# Patient Record
Sex: Female | Born: 1990 | Race: White | Hispanic: No | Marital: Married | Smoking: Never smoker
Health system: Southern US, Community
[De-identification: ages and names within clinical notes are randomized; demographics above are authoritative.]

## PROBLEM LIST (undated history)

## (undated) DIAGNOSIS — L709 Acne, unspecified: Secondary | ICD-10-CM

## (undated) DIAGNOSIS — Z23 Encounter for immunization: Secondary | ICD-10-CM

## (undated) DIAGNOSIS — J309 Allergic rhinitis, unspecified: Secondary | ICD-10-CM

## (undated) HISTORY — DX: Acne, unspecified: L70.9

## (undated) HISTORY — DX: Allergic rhinitis, unspecified: J30.9

## (undated) HISTORY — DX: Encounter for immunization: Z23

## (undated) HISTORY — PX: NO PAST SURGERIES: SHX2092

---
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
## 2016-02-02 DIAGNOSIS — R002 Palpitations: Secondary | ICD-10-CM | POA: Insufficient documentation

## 2016-04-10 ENCOUNTER — Emergency Department
Admission: EM | Admit: 2016-04-10 | Discharge: 2016-04-10 | Disposition: A | Discharge status: Home / Self Care | Payer: BLUE CROSS/BLUE SHIELD | Attending: Emergency Medicine | Admitting: Emergency Medicine

## 2016-04-10 ENCOUNTER — Encounter: Payer: Self-pay | Admitting: Emergency Medicine

## 2016-04-10 DIAGNOSIS — R109 Unspecified abdominal pain: Secondary | ICD-10-CM | POA: Diagnosis present

## 2016-04-10 DIAGNOSIS — K529 Noninfective gastroenteritis and colitis, unspecified: Secondary | ICD-10-CM | POA: Diagnosis not present

## 2016-04-10 LAB — COMPREHENSIVE METABOLIC PANEL WITH GFR
ALT: 46 U/L (ref 14–54)
AST: 36 U/L (ref 15–41)
Albumin: 4.7 g/dL (ref 3.5–5.0)
Alkaline Phosphatase: 45 U/L (ref 38–126)
Anion gap: 12 (ref 5–15)
BUN: 16 mg/dL (ref 6–20)
CO2: 22 mmol/L (ref 22–32)
Calcium: 9.3 mg/dL (ref 8.9–10.3)
Chloride: 101 mmol/L (ref 101–111)
Creatinine, Ser: 0.62 mg/dL (ref 0.44–1.00)
GFR calc Af Amer: >60 mL/min
GFR calc non Af Amer: >60 mL/min
Glucose, Bld: 95 mg/dL (ref 65–99)
Potassium: 3.6 mmol/L (ref 3.5–5.1)
Sodium: 135 mmol/L (ref 135–145)
Total Bilirubin: 1.1 mg/dL (ref 0.3–1.2)
Total Protein: 8.2 g/dL — ABNORMAL HIGH (ref 6.5–8.1)

## 2016-04-10 LAB — URINALYSIS COMPLETE WITH MICROSCOPIC (ARMC ONLY)
Bilirubin Urine: NEGATIVE
Glucose, UA: NEGATIVE mg/dL
Hgb urine dipstick: NEGATIVE
Leukocytes, UA: NEGATIVE
Nitrite: NEGATIVE
Protein, ur: NEGATIVE mg/dL
RBC / HPF: NONE SEEN RBC/hpf (ref 0–5)
Specific Gravity, Urine: 1.025 (ref 1.005–1.030)
pH: 5 (ref 5.0–8.0)

## 2016-04-10 LAB — CBC
HCT: 43.2 % (ref 35.0–47.0)
Hemoglobin: 15.3 g/dL (ref 12.0–16.0)
MCH: 30.9 pg (ref 26.0–34.0)
MCHC: 35.4 g/dL (ref 32.0–36.0)
MCV: 87.1 fL (ref 80.0–100.0)
Platelets: 231 K/uL (ref 150–440)
RBC: 4.95 MIL/uL (ref 3.80–5.20)
RDW: 13.1 % (ref 11.5–14.5)
WBC: 16.1 K/uL — ABNORMAL HIGH (ref 3.6–11.0)

## 2016-04-10 LAB — PREGNANCY, URINE: Preg Test, Ur: NEGATIVE

## 2016-04-10 LAB — LIPASE, BLOOD: Lipase: 28 U/L (ref 11–51)

## 2016-04-10 MED ORDER — ONDANSETRON HCL 4 MG PO TABS: 4.0000 mg | ORAL_TABLET | Freq: Three times a day (TID) | ORAL | 0 refills | Status: DC | PRN

## 2016-04-10 MED ORDER — SODIUM CHLORIDE 0.9 % IV BOLUS (SEPSIS)
1000.0000 mL | Freq: Once | INTRAVENOUS | Status: AC
  Administered 2016-04-10: 1000 mL via INTRAVENOUS

## 2016-04-10 MED ORDER — ONDANSETRON HCL 4 MG/2ML IJ SOLN
4.0000 mg | Freq: Once | INTRAMUSCULAR | Status: AC
  Administered 2016-04-10: 4 mg via INTRAVENOUS
  Filled 2016-04-10: qty 2

## 2016-04-10 NOTE — ED Provider Notes (Signed)
[facility] Emergency Department Provider Note    ____________________________________________   I have reviewed the triage vital signs and the nursing notes.   HISTORY  Chief Complaint Emesis and Abdominal Pain   History limited by: Not Limited   HPI Alexandria Owen is a 25 y.o. female who presents to the emergency department today because of concerns for nausea and vomiting. The patient states that these symptoms started today. She has had multiple episodes of both vomiting and diarrhea. She has not no 70 blood in either. This has been accompanied by some mild abdominal discomfort. Patient has not appreciated any fevers. She did not eat or drink anything unusual last night. No known sick contacts.   History reviewed. No pertinent past medical history.  There are no active problems to display for this patient.   History reviewed. No pertinent surgical history.  Prior to Admission medications   Not on File    Allergies Review of patient's allergies indicates no known allergies.  No family history on file.  Social History Social History  Substance Use Topics  . Smoking status: Never Smoker  . Smokeless tobacco: Never Used  . Alcohol use Yes     Comment: occasional    Review of Systems  Constitutional: Negative for fever. Cardiovascular: Negative for chest pain. Respiratory: Negative for shortness of breath. Gastrointestinal: Positive for nausea vomiting and diarrhea. Mild amount of abdominal discomfort. Neurological: Negative for headaches, focal weakness or numbness.   10-point ROS otherwise negative.  ____________________________________________   PHYSICAL EXAM:  VITAL SIGNS: ED Triage Vitals [04/10/16 1652]  Enc Vitals Group     BP 127/81     Pulse Rate (!) 111     Resp 16     Temp 99.1 F (37.3 C)     Temp Source Oral     SpO2 98 %     Weight 170 lb (77.1 kg)     Height 5\' 8"  (1.727 m)     Head Circumference      Peak  Flow      Pain Score 0   Constitutional: Alert and oriented. Well appearing and in no distress. Eyes: Conjunctivae are normal. Normal extraocular movements. ENT   Head: Normocephalic and atraumatic.   Nose: No congestion/rhinnorhea.   Mouth/Throat: Mucous membranes are moist.   Neck: No stridor. Hematological/Lymphatic/Immunilogical: No cervical lymphadenopathy. Cardiovascular: Normal rate, regular rhythm.  No murmurs, rubs, or gallops. Respiratory: Normal respiratory effort without tachypnea nor retractions. Breath sounds are clear and equal bilaterally. No wheezes/rales/rhonchi. Gastrointestinal: Soft and Minimally tender to palpation diffusely. Somewhat worse tenderness in the right lower quadrant. Genitourinary: Deferred Musculoskeletal: Normal range of motion in all extremities. No lower extremity edema. Neurologic:  Normal speech and language. No gross focal neurologic deficits are appreciated.  Skin:  Skin is warm, dry and intact. No rash noted. Psychiatric: Mood and affect are normal. Speech and behavior are normal. Patient exhibits appropriate insight and judgment.  ____________________________________________    LABS (pertinent positives/negatives)  Labs Reviewed  COMPREHENSIVE METABOLIC PANEL - Abnormal; Notable for the following:       Result Value   Total Protein 8.2 (*)    All other components within normal limits  CBC - Abnormal; Notable for the following:    WBC 16.1 (*)    All other components within normal limits  URINALYSIS COMPLETEWITH MICROSCOPIC ([facility] ONLY) - Abnormal; Notable for the following:    Color, Urine YELLOW (*)    APPearance CLEAR (*)  Ketones, ur 1+ (*)    Bacteria, UA RARE (*)    Squamous Epithelial / LPF 0-5 (*)    All other components within normal limits  LIPASE, BLOOD  PREGNANCY, URINE     ____________________________________________   EKG  None  ____________________________________________     RADIOLOGY  None  ____________________________________________   PROCEDURES  Procedures  ____________________________________________   INITIAL IMPRESSION / ASSESSMENT AND PLAN / ED COURSE  Pertinent labs & imaging results that were available during my care of the patient were reviewed by me and considered in my medical decision making (see chart for details).  Patient presented to the emergency department today because of episodes of nausea vomiting and diarrhea. On exam she had some mild diffuse abdominal tenderness perhaps slightly worse in the right lower quadrant. I had a discussion with the patient. At this point I would doubt appendicitis given the fairly unremarkable exam and that the patient herself is not experiencing any pain in the right lower quadrant. Additionally no fevers. The patient did have leukocytosis. Patient felt better after fluids and medication and repeat abd exam without significant tenderness. We did discuss risks and benefits of CT scan. At this juncture the patient chose to defer ct scan which I think is reasonable. We did discuss appendicitis return precautions. ____________________________________________   FINAL CLINICAL IMPRESSION(S) / ED DIAGNOSES  Final diagnoses:  Gastroenteritis     Note: This dictation was prepared with Dragon dictation. Any transcriptional errors that result from this process are unintentional    Nance Pear, MD 04/10/16 2120

## 2016-04-10 NOTE — Discharge Instructions (Signed)
Please seek medical attention for any high fevers, chest pain, shortness of breath, change in behavior, persistent vomiting, bloody stool or any other new or concerning symptoms.  

## 2016-04-10 NOTE — ED Triage Notes (Signed)
Vomiting since 0300.  Diarrhea since 0900.  Also c/o mid abdominal pain.

## 2016-04-10 NOTE — ED Notes (Signed)

## 2017-12-16 ENCOUNTER — Ambulatory Visit (INDEPENDENT_AMBULATORY_CARE_PROVIDER_SITE_OTHER): Payer: Managed Care, Other (non HMO) | Admitting: Obstetrics and Gynecology

## 2017-12-16 ENCOUNTER — Encounter: Payer: Self-pay | Admitting: Obstetrics and Gynecology

## 2017-12-16 VITALS — BP 122/80 | HR 92 | Ht 68.0 in | Wt 175.0 lb

## 2017-12-16 DIAGNOSIS — Z124 Encounter for screening for malignant neoplasm of cervix: Secondary | ICD-10-CM

## 2017-12-16 DIAGNOSIS — Z3046 Encounter for surveillance of implantable subdermal contraceptive: Secondary | ICD-10-CM

## 2017-12-16 DIAGNOSIS — Z3169 Encounter for other general counseling and advice on procreation: Secondary | ICD-10-CM | POA: Diagnosis not present

## 2017-12-16 DIAGNOSIS — Z01419 Encounter for gynecological examination (general) (routine) without abnormal findings: Secondary | ICD-10-CM | POA: Diagnosis not present

## 2017-12-16 NOTE — Patient Instructions (Signed)
I value your feedback and entrusting us with your care. If you get a [place] patient survey, I would appreciate you taking the time to let us know about your experience today. Thank you! 

## 2017-12-16 NOTE — Progress Notes (Signed)
PCP:  Patient, No Pcp Per   Chief Complaint  Patient presents with  . Gynecologic Exam    Would like to talk about getting pregnant      HPI:      [name] is a 27 y.o. G0P0000 who LMP was Patient's last menstrual period was 12/02/2017 (approximate)., presents today for her annual examination.  Her menses are Q4-5 wks with nexplanon, lasting 7 days.  Dysmenorrhea none. She does have intermenstrual bleeding.  Sex activity: single partner, contraception -  Nexplanon placed 01/25/15. Pt interested in conception later this fall.  Last Pap: October 28, 2014  Results were: no abnormalities  Hx of STDs: none  There is no FH of breast cancer. There is no FH of ovarian cancer. The patient does do self-breast exams.  Tobacco use: The patient denies current or previous tobacco use. Alcohol use: social drinker No drug use.  Exercise: moderately active  She does get adequate calcium and Vitamin D in her diet.   Past Medical History:  Diagnosis Date  . Allergic rhinitis   . Vaccine for human papilloma virus (HPV) types 6, 11, 16, and 18 administered     History reviewed. No pertinent surgical history.  Family History  Problem Relation Age of Onset  . Breast cancer Neg Hx   . Ovarian cancer Neg Hx     Social History   Socioeconomic History  . Marital status: Unknown    Spouse name: Not on file  . Number of children: Not on file  . Years of education: Not on file  . Highest education level: Not on file  Occupational History  . Not on file  Social Needs  . Financial resource strain: Not on file  . Food insecurity:    Worry: Not on file    Inability: Not on file  . Transportation needs:    Medical: Not on file    Non-medical: Not on file  Tobacco Use  . Smoking status: Never Smoker  . Smokeless tobacco: Never Used  Substance and Sexual Activity  . Alcohol use: Yes    Comment: occasional  . Drug use: No  . Sexual activity: Yes    Birth control/protection:  Implant  Lifestyle  . Physical activity:    Days per week: Not on file    Minutes per session: Not on file  . Stress: Not on file  Relationships  . Social connections:    Talks on phone: Not on file    Gets together: Not on file    Attends religious service: Not on file    Active member of club or organization: Not on file    Attends meetings of clubs or organizations: Not on file    Relationship status: Not on file  . Intimate partner violence:    Fear of current or ex partner: Not on file    Emotionally abused: Not on file    Physically abused: Not on file    Forced sexual activity: Not on file  Other Topics Concern  . Not on file  Social History Narrative  . Not on file    Outpatient Medications Prior to Visit  Medication Sig Dispense Refill  . etonogestrel (NEXPLANON) 68 MG IMPL implant Inject into the skin.    Marland Kitchen omeprazole (PRILOSEC) 2 mg/mL SUSP Take by mouth.    . ondansetron (ZOFRAN) 4 MG tablet Take 1 tablet (4 mg total) by mouth every 8 (eight) hours as needed for nausea or vomiting. (Patient  not taking: Reported on 12/16/2017) 20 tablet 0   No facility-administered medications prior to visit.     ROS:  Review of Systems  Constitutional: Negative for fatigue, fever and unexpected weight change.  Respiratory: Negative for cough, shortness of breath and wheezing.   Cardiovascular: Negative for chest pain, palpitations and leg swelling.  Gastrointestinal: Negative for blood in stool, constipation, diarrhea, nausea and vomiting.  Endocrine: Negative for cold intolerance, heat intolerance and polyuria.  Genitourinary: Positive for dyspareunia. Negative for dysuria, flank pain, frequency, genital sores, hematuria, menstrual problem, pelvic pain, urgency, vaginal bleeding, vaginal discharge and vaginal pain.  Musculoskeletal: Negative for back pain, joint swelling and myalgias.  Skin: Negative for rash.  Neurological: Negative for dizziness, syncope, light-headedness,  numbness and headaches.  Hematological: Negative for adenopathy.  Psychiatric/Behavioral: Negative for agitation, confusion, sleep disturbance and suicidal ideas. The patient is not nervous/anxious.   BREAST: Pos for tenderness   Objective: BP 122/80   Pulse 92   Ht 5\' 8"  (1.727 m)   Wt 175 lb (79.4 kg)   LMP 12/02/2017 (Approximate)   BMI 26.61 kg/m    Physical Exam  Constitutional: She is oriented to person, place, and time. She appears well-developed and well-nourished.  Genitourinary: Vagina normal and uterus normal. There is no rash or tenderness on the right labia. There is no rash or tenderness on the left labia. No erythema or tenderness in the vagina. No vaginal discharge found. Right adnexum does not display mass and does not display tenderness. Left adnexum does not display mass and does not display tenderness. Cervix does not exhibit motion tenderness or polyp. Uterus is not enlarged or tender.  Neck: Normal range of motion. No thyromegaly present.  Cardiovascular: Normal rate, regular rhythm and normal heart sounds.  No murmur heard. Pulmonary/Chest: Effort normal and breath sounds normal. Right breast exhibits no mass, no nipple discharge, no skin change and no tenderness. Left breast exhibits no mass, no nipple discharge, no skin change and no tenderness.  Abdominal: Soft. There is no tenderness. There is no guarding.  Musculoskeletal: Normal range of motion.  Neurological: She is alert and oriented to person, place, and time. No cranial nerve deficit.  Psychiatric: She has a normal mood and affect. Her behavior is normal.  Vitals reviewed.   Assessment/Plan: Encounter for annual routine gynecological examination  Cervical cancer screening - Plan: IGP, rfx Aptima HPV ASCU  Encounter for surveillance of implantable subdermal contraceptive - Due for removal 6/16. RTO for removal prn. Wants to conceive this fall.  Pre-conception counseling - Start PNVs.         GYN  counsel adequate intake of calcium and vitamin D, diet and exercise     F/U  Return in about 1 year (around 12/17/2018).  Alicia B. Copland, PA-C 12/16/2017 2:32 PM

## 2017-12-18 ENCOUNTER — Ambulatory Visit: Payer: Managed Care, Other (non HMO) | Admitting: Obstetrics and Gynecology

## 2017-12-18 ENCOUNTER — Encounter: Payer: Self-pay | Admitting: Obstetrics and Gynecology

## 2017-12-18 VITALS — BP 114/78 | HR 83 | Ht 68.0 in | Wt 170.0 lb

## 2017-12-18 DIAGNOSIS — Z3046 Encounter for surveillance of implantable subdermal contraceptive: Secondary | ICD-10-CM | POA: Diagnosis not present

## 2017-12-18 LAB — IGP, RFX APTIMA HPV ASCU: PAP SMEAR COMMENT: 0

## 2017-12-18 NOTE — Progress Notes (Addendum)
   Chief Complaint  Patient presents with  . Contraception    Nexplanon removal      History of Present Illness:  Alexandria Owen is a 26 y.o. that had a nexplanon placed approximately 3 years  ago. Since that time, she has done well. She would like to conceive this fall.   Nexplanon removal Procedure note - The Nexplanon was noted in the patient's arm and the end was identified. The skin was cleansed with a Betadine solution. A small injection of subcutaneous lidocaine with epinephrine was given over the end of the implant. An incision was made at the end of the implant. The rod was noted in the incision and grasped with a hemostat. It was noted to be intact.  Steri-Strip was placed approximating the incision. Hemostasis was noted.  Assessment: Nexplanon removal Pt plans to conceive in the fall. Condoms in meantime.   Plan:   She was told to remove the dressing in 12-24 hours, to keep the incision area dry for 24 hours and to remove the Steristrip in 2-3  days.  Notify us if any signs of tenderness, redness, pain, or fevers develop.   Alicia B. Copland, PA-C 12/18/2017 11:52 AM

## 2017-12-18 NOTE — Patient Instructions (Signed)
I value your feedback and entrusting us with your care. If you get a [place] patient survey, I would appreciate you taking the time to let us know about your experience today. Thank you!  Remove the dressing in 24 hours,  keep the incision area dry for 24 hours and remove the Steristrip in 2-3  days.  Notify us if any signs of tenderness, redness, pain, or fevers develop.  

## 2018-08-13 NOTE — L&D Delivery Note (Signed)
Date of delivery: 06/05/2019 Estimated Date of Delivery: 05/26/19 Patient's last menstrual period was 08/19/2018 (exact date). EGA: [redacted]w[redacted]d  Delivery Note At 7:26 PM a viable female was delivered via Vaginal, Spontaneous Presentation: OA; ROA). APGAR: 8, 9; weight 8 lb 1.8 oz (3680 g).   Placenta status: manual extraction, intact.  Cord:  with the following complications: none.  Cord pH: NA  Called to see patient.  Mom pushed well to deliver a viable female infant.  The head followed by shoulders, which delivered without difficulty, and the rest of the body.  No nuchal cord noted.  Baby to mom's chest.  Cord clamped and cut after 3 min delay.  Cord blood obtained. Called Dr Georgianne Fick approximately 38 minutes after delivery after attempting to deliver placenta. Placenta delivered manually, intact, with a 3-vessel cord. Gentamicin and Clindamycin IV prophylaxis given. All counts correct.  Hemostasis obtained with IV pitocin and fundal massage.  Please see note from Dr Georgianne Fick regarding manual extraction of placenta.   Anesthesia:  epidural Episiotomy:  none Lacerations:  Left vaginal wall abrasion, hemostatic Suture Repair: none Est. Blood Loss (mL):  1710  Mom to postpartum.  Baby to Couplet care / Skin to Skin.  Rod Can, CNM 06/05/2019, 8:47 PM

## 2018-09-25 ENCOUNTER — Other Ambulatory Visit (HOSPITAL_COMMUNITY)
Admission: RE | Admit: 2018-09-25 | Discharge: 2018-09-25 | Disposition: A | Discharge status: Home / Self Care | Payer: Managed Care, Other (non HMO) | Source: Ambulatory Visit | Attending: Advanced Practice Midwife | Admitting: Advanced Practice Midwife

## 2018-09-25 ENCOUNTER — Encounter: Payer: Self-pay | Admitting: Advanced Practice Midwife

## 2018-09-25 ENCOUNTER — Ambulatory Visit (INDEPENDENT_AMBULATORY_CARE_PROVIDER_SITE_OTHER): Payer: Managed Care, Other (non HMO) | Admitting: Advanced Practice Midwife

## 2018-09-25 VITALS — BP 110/58 | Wt 175.0 lb

## 2018-09-25 DIAGNOSIS — Z113 Encounter for screening for infections with a predominantly sexual mode of transmission: Secondary | ICD-10-CM | POA: Diagnosis present

## 2018-09-25 DIAGNOSIS — Z3400 Encounter for supervision of normal first pregnancy, unspecified trimester: Secondary | ICD-10-CM

## 2018-09-25 DIAGNOSIS — Z3A01 Less than 8 weeks gestation of pregnancy: Secondary | ICD-10-CM

## 2018-09-25 DIAGNOSIS — Z3401 Encounter for supervision of normal first pregnancy, first trimester: Secondary | ICD-10-CM

## 2018-09-25 NOTE — Progress Notes (Signed)
New Obstetric Patient H&P    Chief Complaint: "Desires prenatal care"   History of Present Illness: Patient is a 28 y.o. G1P0000 Not Hispanic or Latino female, presents with amenorrhea and positive home pregnancy test. Patient's last menstrual period was 08/19/2018 (exact date). and based on her  LMP, her EDD is Estimated Date of Delivery: 05/26/19 and her EGA is [redacted]w[redacted]d. Cycles are 5. days, regular, and occur approximately every : 28 days. Her last pap smear was 1 years ago and was no abnormalities.    She had a urine pregnancy test which was positive 1 day(s)  ago. Her last menstrual period was normal and lasted for  3 or 4 day(s). Since her LMP she claims she has experienced breast tenderness, fatigue. She denies vaginal bleeding. Her past medical history is noncontributory. This is her first pregnancy.  Since her LMP, she admits to the use of tobacco products  no She claims she has gained   3 pounds since the start of her pregnancy.  There are cats in the home in the home  No, she does have rabbits She admits close contact with children on a regular basis  no  She has had chicken pox in the past no She has had Tuberculosis exposures, symptoms, or previously tested positive for TB   no Current or past history of domestic violence. no  Genetic Screening/Teratology Counseling: (Includes patient, baby's father, or anyone in either family with:)   30. Patient's age >/= 89 at [facility]  no 2. Thalassemia ([place], [place], [place], or Asian background): MCV<80  no 3. Neural tube defect (meningomyelocele, spina bifida, anencephaly)  no 4. Congenital heart defect  no  5. Down syndrome  no 6. Tay-Sachs (Jewish, [place])  no 7. Canavan's Disease  no 8. Sickle cell disease or trait (African)  no  9. Hemophilia or other blood disorders  no  10. Muscular dystrophy  no  11. Cystic fibrosis  no  12. Huntington's Chorea  no  13. Mental retardation/autism  no 14. Other inherited genetic or  chromosomal disorder  no 15. Maternal metabolic disorder (DM, PKU, etc)  no 16. Patient or FOB with a child with a birth defect not listed above no  16a. Patient or FOB with a birth defect themselves no 17. Recurrent pregnancy loss, or stillbirth  no  18. Any medications since LMP other than prenatal vitamins (include vitamins, supplements, OTC meds, drugs, alcohol)  no 19. Any other genetic/environmental exposure to discuss  no  Infection History:   1. Lives with someone with TB or TB exposed  no  2. Patient or partner has history of genital herpes  no 3. Rash or viral illness since LMP  no 4. History of STI (GC, CT, HPV, syphilis, HIV)  no 5. History of recent travel :  no  Other pertinent information:  no     Review of Systems:10 point review of systems negative unless otherwise noted in HPI  Past Medical History:  Past Medical History:  Diagnosis Date  . Allergic rhinitis   . Vaccine for human papilloma virus (HPV) types 6, 11, 16, and 18 administered     Past Surgical History:  History reviewed. No pertinent surgical history.  Gynecologic History: Patient's last menstrual period was 08/19/2018 (exact date).  Obstetric History: G1P0000  Family History:  Family History  Problem Relation Age of Onset  . Breast cancer Neg Hx   . Ovarian cancer Neg Hx     Social History:  Social  History   Socioeconomic History  . Marital status: Married    Spouse name: Not on file  . Number of children: Not on file  . Years of education: Not on file  . Highest education level: Not on file  Occupational History  . Not on file  Social Needs  . Financial resource strain: Not on file  . Food insecurity:    Worry: Not on file    Inability: Not on file  . Transportation needs:    Medical: Not on file    Non-medical: Not on file  Tobacco Use  . Smoking status: Never Smoker  . Smokeless tobacco: Never Used  Substance and Sexual Activity  . Alcohol use: Yes    Comment:  occasional  . Drug use: No  . Sexual activity: Yes    Birth control/protection: None  Lifestyle  . Physical activity:    Days per week: Not on file    Minutes per session: Not on file  . Stress: Not on file  Relationships  . Social connections:    Talks on phone: Not on file    Gets together: Not on file    Attends religious service: Not on file    Active member of club or organization: Not on file    Attends meetings of clubs or organizations: Not on file    Relationship status: Not on file  . Intimate partner violence:    Fear of current or ex partner: Not on file    Emotionally abused: Not on file    Physically abused: Not on file    Forced sexual activity: Not on file  Other Topics Concern  . Not on file  Social History Narrative  . Not on file    Allergies:  No Known Allergies  Medications: Prior to Admission medications   Medication Sig Start Date End Date Taking? Authorizing Provider  Prenatal Vit-Fe Fumarate-FA (MULTIVITAMIN-PRENATAL) 27-0.8 MG TABS tablet Take 1 tablet by mouth daily at 12 noon.   Yes [provider]  omeprazole (PRILOSEC) 2 mg/mL SUSP Take by mouth.    [provider]  ondansetron (ZOFRAN) 4 MG tablet Take 1 tablet (4 mg total) by mouth every 8 (eight) hours as needed for nausea or vomiting. Patient not taking: Reported on 12/16/2017 04/10/16   Nance Pear, MD    Physical Exam Vitals: Blood pressure (!) 110/58, weight 175 lb (79.4 kg), last menstrual period 08/19/2018.  General: NAD HEENT: normocephalic, anicteric Thyroid: no enlargement, no palpable nodules Pulmonary: No increased work of breathing, CTAB Cardiovascular: RRR, distal pulses 2+ Abdomen: NABS, soft, non-tender, non-distended.  Umbilicus without lesions.  No hepatomegaly, splenomegaly or masses palpable. No evidence of hernia  Genitourinary: deferred for no concerns/recent PAP smear Extremities: no edema, erythema, or tenderness Neurologic: Grossly  intact Psychiatric: mood appropriate, affect full   Assessment: 28 y.o. G1P0000 at [redacted]w[redacted]d presenting to initiate prenatal care  Plan: 1) Avoid alcoholic beverages. 2) Patient encouraged not to smoke.  3) Discontinue the use of all non-medicinal drugs and chemicals.  4) Take prenatal vitamins daily.  5) Nutrition, food safety (fish, cheese advisories, and high nitrite foods) and exercise discussed. 6) Hospital and practice style discussed with cross coverage system.  7) Genetic Screening, such as with 1st Trimester Screening, cell free fetal DNA, AFP testing, and Ultrasound, as well as with amniocentesis and CVS as appropriate, is discussed with patient. At the conclusion of today's visit patient requested cell free DNA genetic testing 8) Patient is asked about  travel to areas at risk for the [place] virus, and counseled to avoid travel and exposure to mosquitoes or sexual partners who may have themselves been exposed to the virus. Testing is discussed, and will be ordered as appropriate. 9) Urine culture and urine aptima today 10) Return in 2 weeks for dating scan and rob    Rod Can, [place], [facility] 09/25/2018, 4:13 PM

## 2018-09-25 NOTE — Patient Instructions (Signed)
Exercise During Pregnancy For people of all ages, exercise is an important part of being healthy. Exercise improves heart and lung function and helps to maintain strength, flexibility, and a healthy body weight. Exercise also boosts energy levels and elevates mood. For most women, maintaining an exercise routine throughout pregnancy is recommended. It is only on rare occasions and with certain medical conditions or pregnancy complications that women may be asked to limit or avoid exercise during pregnancy. What are some other benefits to exercising during pregnancy? Along with maintaining strength and flexibility, exercising throughout pregnancy can help to:  Keep strength in muscles that are very important during labor and childbirth.  Decrease low back pain during pregnancy.  Decrease the risk of developing gestational diabetes mellitus (GDM).  Improve blood sugar (glucose) control for women who have GDM.  Decrease the risk of developing preeclampsia. This is a serious condition that causes high blood pressure along with other symptoms, such as swelling and headaches.  Decrease the risk of cesarean delivery.  Speed up the recovery after giving birth. How often should I exercise? Unless your health care provider gives you different instructions, you should try to exercise on most days or all days of the week. In general, try to exercise with moderate intensity for about 150 minutes per week. This can be spread out across several days, such as exercising for 30 minutes per day on 5 days of each week. You can tell that you are exercising at a moderate intensity if you have a higher heart rate and faster breathing, but you are still able to hold a conversation. What types of moderate-intensity exercise are recommended during pregnancy? There are many types of exercise that are safe for you to do during pregnancy. Unless your health care provider gives you different instructions, do a variety of  exercises that safely increase your heart and breathing (cardiopulmonary) rates and help you to build and maintain muscle strength (strength training). You should always be able to talk in full sentences while exercising during pregnancy. Some examples of exercising that is safe to do during pregnancy include:  Brisk walking or hiking.  Swimming.  Water aerobics.  Riding a stationary bike.  Strength training.  Modified yoga or Pilates. Tell your instructor that you are pregnant. Avoid overstretching and avoid lying on your back for long periods of time.  Running or jogging. Only choose this type of exercise if: ? You ran or jogged regularly before your pregnancy. ? You can run or jog and still talk in complete sentences. What types of exercise should I not do during pregnancy? Depending on your level of fitness and whether you exercised regularly before your pregnancy, you may be advised to limit vigorous-intensity exercise during your pregnancy. You can tell that you are exercising at a vigorous intensity if you are breathing much harder and faster and cannot hold a conversation while exercising. Some examples of exercising that you should avoid during pregnancy include:  Contact sports.  Activities that place you at risk for falling on or being hit in the belly, such as downhill skiing, water skiing, surfing, rock climbing, cycling, gymnastics, and horseback riding.  Scuba diving.  Sky diving.  Yoga or Pilates in a room that is heated to extreme temperatures ("hot yoga" or "hot Pilates").  Jogging or running, unless you ran or jogged regularly before your pregnancy. While jogging or running, you should always be able to talk in full sentences. Do not run or jog so vigorously that you  are unable to have a conversation.  If you are not used to exercising at elevation (more than 6,000 feet above sea level), do not do so during your pregnancy. When should I avoid exercising during  pregnancy? Certain medical conditions can make it unsafe to exercise during pregnancy, or they may increase your risk of miscarriage or early labor and birth. Some of these conditions include:  Some types of heart disease.  Some types of lung disease.  Placenta previa. This is when the placenta partially or completely covers the opening of the uterus (cervix).  Frequent bleeding from the vagina during your pregnancy.  Incompetent cervix. This is when your cervix does not remain as tightly closed during pregnancy as it should.  Premature labor.  Ruptured membranes. This is when the protective sac (amniotic sac) opens up and amniotic fluid leaks from your vagina.  Severely low blood count (anemia).  Preeclampsia or pregnancy-caused high blood pressure.  Carrying more than one baby (multiple gestation) and having an additional risk of early labor.  Poorly controlled diabetes.  Being severely underweight or severely overweight.  Intrauterine growth restriction. This is when your baby's growth and development during pregnancy are slower than expected.  Other medical conditions. Ask your health care provider if any apply to you. What else should I know about exercising during pregnancy? You should take these precautions while exercising during pregnancy:  Avoid overheating. ? Wear loose-fitting, breathable clothes. ? Do not exercise in very high temperatures.  Avoid dehydration. Drink enough water before, during, and after exercise to keep your urine clear or pale yellow.  Avoid overstretching. Because of hormone changes during pregnancy, it is easy to overstretch muscles, tendons, and ligaments during pregnancy.  Start slowly and ask your health care provider to recommend types of exercise that are safe for you, if exercising regularly is new for you. Pregnancy is not a time for exercising to lose weight. When should I seek medical care? You should stop exercising and call your  health care provider if you have any unusual symptoms, such as:  Mild uterine contractions or abdominal cramping.  Dizziness that does not improve with rest. When should I seek immediate medical care? You should stop exercising and call your local emergency services (911 in the [place] if you have any unusual symptoms, such as:  Sudden, severe pain in your low back or your belly.  Uterine contractions or abdominal cramping that do not improve with rest.  Chest pain.  Bleeding or fluid leaking from your vagina.  Shortness of breath. This information is not intended to replace advice given to you by your health care provider. Make sure you discuss any questions you have with your health care provider. Document Released: 07/30/2005 Document Revised: 12/28/2015 Document Reviewed: 10/07/2014 Elsevier Interactive Patient Education  2019 [employer] for Pregnant Women While you are pregnant, your body requires additional nutrition to help support your growing baby. You also have a higher need for some vitamins and minerals, such as folic acid, calcium, iron, and vitamin D. Eating a healthy, well-balanced diet is very important for your health and your baby's health. Your need for extra calories varies for the three 37-month segments of your pregnancy (trimesters). For most women, it is recommended to consume:  150 extra calories a day during the first trimester.  300 extra calories a day during the second trimester.  300 extra calories a day during the third trimester. What are tips for following this plan?   Do  not try to lose weight or go on a diet during pregnancy.  Limit your overall intake of foods that have "empty calories." These are foods that have little nutritional value, such as sweets, desserts, candies, and sugar-sweetened beverages.  Eat a variety of foods (especially fruits and vegetables) to get a full range of vitamins and minerals.  Take a prenatal vitamin  to help meet your additional vitamin and mineral needs during pregnancy, specifically for folic acid, iron, calcium, and vitamin D.  Remember to stay active. Ask your health care provider what types of exercise and activities are safe for you.  Practice good food safety and cleanliness. Wash your hands before you eat and after you prepare raw meat. Wash all fruits and vegetables well before peeling or eating. Taking these actions can help to prevent food-borne illnesses that can be very dangerous to your baby, such as listeriosis. Ask your health care provider for more information about listeriosis. What does 150 extra calories look like? Healthy options that provide 150 extra calories each day could be any of the following:  6-8 oz (170-230 g) of plain low-fat yogurt with  cup of berries.  1 apple with 2 teaspoons (11 g) of peanut butter.  Cut-up vegetables with  cup (60 g) of hummus.  8 oz (230 mL) or 1 cup of low-fat chocolate milk.  1 stick of string cheese with 1 medium orange.  1 peanut butter and jelly sandwich that is made with one slice of whole-wheat bread and 1 tsp (5 g) of peanut butter. For 300 extra calories, you could eat two of those healthy options each day. What is a healthy amount of weight to gain? The right amount of weight gain for you is based on your BMI before you became pregnant. If your BMI:  Was less than 18 (underweight), you should gain 28-40 lb (13-18 kg).  Was 18-24.9 (normal), you should gain 25-35 lb (11-16 kg).  Was 25-29.9 (overweight), you should gain 15-25 lb (7-11 kg).  Was 30 or greater (obese), you should gain 11-20 lb (5-9 kg). What if I am having twins or multiples? Generally, if you are carrying twins or multiples:  You may need to eat 300-600 extra calories a day.  The recommended range for total weight gain is 25-54 lb (11-25 kg), depending on your BMI before pregnancy.  Talk with your health care provider to find out about  nutritional needs, weight gain, and exercise that is right for you. What foods can I eat?  Grains All grains. Choose whole grains, such as whole-wheat bread, oatmeal, or brown rice. Vegetables All vegetables. Eat a variety of colors and types of vegetables. Remember to wash your vegetables well before peeling or eating. Fruits All fruits. Eat a variety of colors and types of fruit. Remember to wash your fruits well before peeling or eating. Meats and other protein foods Lean meats, including chicken, [place], fish, and lean cuts of beef, veal, or pork. If you eat fish or seafood, choose options that are higher in omega-3 fatty acids and lower in mercury, such as salmon, herring, mussels, trout, sardines, pollock, shrimp, crab, and lobster. Tofu. Tempeh. Beans. Eggs. Peanut butter and other nut butters. Make sure that all meats, poultry, and eggs are cooked to food-safe temperatures or "well-done." Two or more servings of fish are recommended each week in order to get the most benefits from omega-3 fatty acids that are found in seafood. Choose fish that are lower in mercury. You can  find more information online:  [employer] Dairy Pasteurized milk and milk alternatives (such as almond milk). Pasteurized yogurt and pasteurized cheese. Cottage cheese. Sour cream. Beverages Water. Juices that contain 100% fruit juice or vegetable juice. Caffeine-free teas and decaffeinated coffee. Drinks that contain caffeine are okay to drink, but it is better to avoid caffeine. Keep your total caffeine intake to less than 200 mg each day (which is 12 oz or 355 mL of coffee, tea, or soda) or the limit as told by your health care provider. Fats and oils Fats and oils are okay to include in moderation. Sweets and desserts Sweets and desserts are okay to include in moderation. Seasoning and other foods All pasteurized condiments. The items listed above may not be a complete list of recommended foods and beverages.  Contact your dietitian for more options. What foods are not recommended? Vegetables Raw (unpasteurized) vegetable juices. Fruits Unpasteurized fruit juices. Meats and other protein foods Lunch meats, bologna, hot dogs, or other deli meats. (If you must eat those meats, reheat them until they are steaming hot.) Refrigerated pat, meat spreads from a meat counter, smoked seafood that is found in the refrigerated section of a store. Raw or undercooked meats, poultry, and eggs. Raw fish, such as sushi or sashimi. Fish that have high mercury content, such as tilefish, shark, swordfish, and king mackerel. To learn more about mercury in fish, talk with your health care provider or look for online resources, such as:  [employer] Dairy Raw (unpasteurized) milk and any foods that have raw milk in them. Soft cheeses, such as feta, queso blanco, queso fresco, Brie, Camembert cheeses, blue-veined cheeses, and Panela cheese (unless it is made with pasteurized milk, which must be stated on the label). Beverages Alcohol. Sugar-sweetened beverages, such as sodas, teas, or energy drinks. Seasoning and other foods Homemade fermented foods and drinks, such as pickles, sauerkraut, or kombucha drinks. (Store-bought pasteurized versions of these are okay.) Salads that are made in a store or deli, such as ham salad, chicken salad, egg salad, tuna salad, and seafood salad. The items listed above may not be a complete list of foods and beverages to avoid. Contact your dietitian for more information. Where to find more information To calculate the number of calories you need based on your height, weight, and activity level, you can use an online calculator such as:  MobileTransition.ch To calculate how much weight you should gain during pregnancy, you can use an online pregnancy weight gain calculator such as:  StreamingFood.com.cy Summary  While you are pregnant,  your body requires additional nutrition to help support your growing baby.  Eat a variety of foods, especially fruits and vegetables to get a full range of vitamins and minerals.  Practice good food safety and cleanliness. Wash your hands before you eat and after you prepare raw meat. Wash all fruits and vegetables well before peeling or eating. Taking these actions can help to prevent food-borne illnesses, such as listeriosis, that can be very dangerous to your baby.  Do not eat raw meat or fish. Do not eat fish that have high mercury content, such as tilefish, shark, swordfish, and king mackerel. Do not eat unpasteurized (raw) dairy.  Take a prenatal vitamin to help meet your additional vitamin and mineral needs during pregnancy, specifically for folic acid, iron, calcium, and vitamin D. This information is not intended to replace advice given to you by your health care provider. Make sure you discuss any questions you have with your health care  provider. Document Released: 05/14/2014 Document Revised: 04/26/2017 Document Reviewed: 04/26/2017 Elsevier Interactive Patient Education  2019 [employer]. Prenatal Care Prenatal care is health care during pregnancy. It helps you and your unborn baby (fetus) stay as healthy as possible. Prenatal care may be provided by a midwife, a family practice health care provider, or a childbirth and pregnancy specialist (obstetrician). How does this affect me? During pregnancy, you will be closely monitored for any new conditions that might develop. To lower your risk of pregnancy complications, you and your health care provider will talk about any underlying conditions you have. How does this affect my baby? Early and consistent prenatal care increases the chance that your baby will be healthy during pregnancy. Prenatal care lowers the risk that your baby will be:  Born early (prematurely).  Smaller than expected at birth (small for gestational age). What  can I expect at the first prenatal care visit? Your first prenatal care visit will likely be the longest. You should schedule your first prenatal care visit as soon as you know that you are pregnant. Your first visit is a good time to talk about any questions or concerns you have about pregnancy. At your visit, you and your health care provider will talk about:  Your medical history, including: ? Any past pregnancies. ? Your family's medical history. ? The baby's father's medical history. ? Any long-term (chronic) health conditions you have and how you manage them. ? Any surgeries or procedures you have had. ? Any current over-the-counter or prescription medicines, herbs, or supplements you are taking.  Other factors that could pose a risk to your baby, including:  Your home setting and your stress levels, including: ? Exposure to abuse or violence. ? Household financial strain. ? Mental health conditions you have.  Your daily health habits, including diet and exercise. Your health care provider will also:  Measure your weight, height, and blood pressure.  Do a physical exam, including a pelvic and breast exam.  Perform blood tests and urine tests to check for: ? Urinary tract infection. ? Sexually transmitted infections (STIs). ? Low iron levels in your blood (anemia). ? Blood type and certain proteins on red blood cells (Rh antibodies). ? Infections and immunity to viruses, such as hepatitis B and rubella. ? HIV (human immunodeficiency virus).  Do an ultrasound to confirm your baby's growth and development and to help predict your estimated due date (EDD). This ultrasound is done with a probe that is inserted into the vagina (transvaginal ultrasound).  Discuss your options for genetic screening.  Give you information about how to keep yourself and your baby healthy, including: ? Nutrition and taking vitamins. ? Physical activity. ? How to manage pregnancy symptoms such as  nausea and vomiting (morning sickness). ? Infections and substances that may be harmful to your baby and how to avoid them. ? Food safety. ? Dental care. ? Working. ? Travel. ? Warning signs to watch for and when to call your health care provider. How often will I have prenatal care visits? After your first prenatal care visit, you will have regular visits throughout your pregnancy. The visit schedule is often as follows:  Up to week 28 of pregnancy: once every 4 weeks.  28-36 weeks: once every 2 weeks.  After 36 weeks: every week until delivery. Some women may have visits more or less often depending on any underlying health conditions and the health of the baby. Keep all follow-up and prenatal care visits as told by  your health care provider. This is important. What happens during routine prenatal care visits? Your health care provider will:  Measure your weight and blood pressure.  Check for fetal heart sounds.  Measure the height of your uterus in your abdomen (fundal height). This may be measured starting around week 20 of pregnancy.  Check the position of your baby inside your uterus.  Ask questions about your diet, sleeping patterns, and whether you can feel the baby move.  Review warning signs to watch for and signs of labor.  Ask about any pregnancy symptoms you are having and how you are dealing with them. Symptoms may include: ? Headaches. ? Nausea and vomiting. ? Vaginal discharge. ? Swelling. ? Fatigue. ? Constipation. ? Any discomfort, including back or pelvic pain. Make a list of questions to ask your health care provider at your routine visits. What tests might I have during prenatal care visits? You may have blood, urine, and imaging tests throughout your pregnancy, such as:  Urine tests to check for glucose, protein, or signs of infection.  Glucose tests to check for a form of diabetes that can develop during pregnancy (gestational diabetes mellitus).  This is usually done around week 24 of pregnancy.  An ultrasound to check your baby's growth and development and to check for birth defects. This is usually done around week 20 of pregnancy.  A test to check for group B strep (GBS) infection. This is usually done around week 36 of pregnancy.  Genetic testing. This may include blood or imaging tests, such as an ultrasound. Some genetic tests are done during the first trimester and some are done during the second trimester. What else can I expect during prenatal care visits? Your health care provider may recommend getting certain vaccines during pregnancy. These may include:  A yearly flu shot (annual influenza vaccine). This is especially important if you will be pregnant during flu season.  Tdap (tetanus, diphtheria, pertussis) vaccine. Getting this vaccine during pregnancy can protect your baby from whooping cough (pertussis) after birth. This vaccine may be recommended between weeks 27 and 36 of pregnancy. Later in your pregnancy, your health care provider may give you information about:  Childbirth and breastfeeding classes.  Choosing a health care provider for your baby.  Umbilical cord banking.  Breastfeeding.  Birth control after your baby is born.  The hospital labor and delivery unit and how to tour it.  Registering at the hospital before you go into labor. Where to find more information  Office on Women's Health: LegalWarrants.gl  American Pregnancy Association: americanpregnancy.org  March of Dimes: marchofdimes.org Summary  Prenatal care helps you and your baby stay as healthy as possible during pregnancy.  Your first prenatal care visit will most likely be the longest.  You will have visits and tests throughout your pregnancy to monitor your health and your baby's health.  Bring a list of questions to your visits to ask your health care provider.  Make sure to keep all follow-up and prenatal care visits with  your health care provider. This information is not intended to replace advice given to you by your health care provider. Make sure you discuss any questions you have with your health care provider. Document Released: 08/02/2003 Document Revised: 07/29/2017 Document Reviewed: 07/29/2017 Elsevier Interactive Patient Education  2019 [employer].

## 2018-09-25 NOTE — Progress Notes (Signed)
NOB C/o cramping, and some dizziness  Flu shot in October

## 2018-09-28 LAB — URINE CULTURE

## 2018-09-29 LAB — CERVICOVAGINAL ANCILLARY ONLY
CHLAMYDIA, DNA PROBE: NEGATIVE
Neisseria Gonorrhea: NEGATIVE
Trichomonas: NEGATIVE

## 2018-10-09 ENCOUNTER — Ambulatory Visit (INDEPENDENT_AMBULATORY_CARE_PROVIDER_SITE_OTHER): Payer: Managed Care, Other (non HMO) | Admitting: Maternal Newborn

## 2018-10-09 ENCOUNTER — Ambulatory Visit (INDEPENDENT_AMBULATORY_CARE_PROVIDER_SITE_OTHER): Payer: Managed Care, Other (non HMO)

## 2018-10-09 ENCOUNTER — Other Ambulatory Visit: Payer: Self-pay | Admitting: Advanced Practice Midwife

## 2018-10-09 VITALS — BP 110/64 | Wt 176.0 lb

## 2018-10-09 DIAGNOSIS — O3480 Maternal care for other abnormalities of pelvic organs, unspecified trimester: Secondary | ICD-10-CM

## 2018-10-09 DIAGNOSIS — N8311 Corpus luteum cyst of right ovary: Secondary | ICD-10-CM | POA: Diagnosis not present

## 2018-10-09 DIAGNOSIS — Z3400 Encounter for supervision of normal first pregnancy, unspecified trimester: Secondary | ICD-10-CM

## 2018-10-09 DIAGNOSIS — O208 Other hemorrhage in early pregnancy: Secondary | ICD-10-CM | POA: Diagnosis not present

## 2018-10-09 DIAGNOSIS — Z3A01 Less than 8 weeks gestation of pregnancy: Secondary | ICD-10-CM

## 2018-10-09 DIAGNOSIS — Z3401 Encounter for supervision of normal first pregnancy, first trimester: Secondary | ICD-10-CM

## 2018-10-09 NOTE — Progress Notes (Signed)
    Routine Prenatal Care Visit  Subjective  Alexandria Owen is a 28 y.o. G1P0000 at [redacted]w[redacted]d being seen today for ongoing prenatal care.  She is currently monitored for the following issues for this low-risk pregnancy and has Heart palpitations and Supervision of normal first pregnancy, antepartum on their problem list.  ----------------------------------------------------------------------------------- Patient reports no complaints.    Vag. Bleeding: None.  Movement: Absent.  ----------------------------------------------------------------------------------- The following portions of the patient's history were reviewed and updated as appropriate: allergies, current medications, past family history, past medical history, past social history, past surgical history and problem list. Problem list updated.  Objective  Blood pressure 110/64, weight 176 lb (79.8 kg), last menstrual period 08/19/2018. Pregravid weight 175 lb (79.4 kg) Total Weight Gain 1 lb (0.454 kg) Body mass index is 26.76 kg/m.  Fetal Status: Fetal Heart Rate (bpm): 139   Movement: Absent     General:  Alert, oriented and cooperative. Patient is in no acute distress.  Skin: Skin is warm and dry. No rash noted.   Cardiovascular: Normal heart rate noted  Respiratory: Normal respiratory effort, no problems with respiration noted  Abdomen: Soft, gravid, appropriate for gestational age. Pain/Pressure: Absent     Pelvic:  Cervical exam deferred        Extremities: Normal range of motion.     Mental Status: Normal mood and affect. Normal behavior. Normal judgment and thought content.    Assessment   28 y.o. G1P0000 at [redacted]w[redacted]d, EDD 05/26/2019 by Last Menstrual Period presenting for a routine prenatal visit.  Plan   Pregnancy #1 Problems (from 08/19/18 to present)    Problem Noted Resolved   Supervision of normal first pregnancy, antepartum 09/25/2018 by Rod Can, CNM No   Overview Addendum 10/09/2018  9:44 AM by Rexene Agent, [facility] Prenatal Labs  Dating  Blood type:     Genetic Screen 1 Screen:    AFP:     Quad:     NIPS: Antibody:   Anatomic [place]  Rubella:   Varicella: @VZVIGG @  GTT Early:               Third trimester:  RPR:     Rhogam  HBsAg:     TDaP vaccine   Flu Shot: Oct 2019 HIV:     Baby Food   Breast                             GBS:   Contraception  Pap:  CBB     CS/VBAC NA   Support Person Gwyndolyn Saxon           Dating scan today shows singleton IUP, size=dates. Small [place] measuring 1.2 x 1.0 x 0.7 cm. Findings discussed with patient.  Please refer to After Visit Summary for other counseling recommendations.   Return in about 4 weeks (around 11/06/2018) for ROB and MaterniTi Goehner, CNM 10/09/2018

## 2018-10-09 NOTE — Progress Notes (Signed)
Dating scan today. No vb. No lof.  °

## 2018-10-10 LAB — RPR+RH+ABO+RUB AB+AB SCR+CB...
ANTIBODY SCREEN: NEGATIVE
HIV SCREEN 4TH GENERATION: NONREACTIVE
Hematocrit: 37.9 % (ref 34.0–46.6)
Hemoglobin: 13 g/dL (ref 11.1–15.9)
Hepatitis B Surface Ag: NEGATIVE
MCH: 29.7 pg (ref 26.6–33.0)
MCHC: 34.3 g/dL (ref 31.5–35.7)
MCV: 87 fL (ref 79–97)
PLATELETS: 351 10*3/uL (ref 150–450)
RBC: 4.38 x10E6/uL (ref 3.77–5.28)
RDW: 12.3 % (ref 11.7–15.4)
RPR Ser Ql: NONREACTIVE
RUBELLA: 1.42 {index} (ref >0.99)
Rh Factor: POSITIVE
VARICELLA: 174 {index} (ref >165)
WBC: 9.4 10*3/uL (ref 3.4–10.8)

## 2018-10-14 ENCOUNTER — Encounter: Payer: Self-pay | Admitting: Maternal Newborn

## 2018-10-14 NOTE — Patient Instructions (Signed)
First Trimester of Pregnancy  The first trimester of pregnancy is from week 1 until the end of week 13 (months 1 through 3). A week after a sperm fertilizes an egg, the egg will implant on the wall of the uterus. This embryo will begin to develop into a baby. Genes from you and your partner will form the baby. The female genes will determine whether the baby will be a boy or a girl. At 6-8 weeks, the eyes and face will be formed, and the heartbeat can be seen on ultrasound. At the end of 12 weeks, all the baby's organs will be formed.  Now that you are pregnant, you will want to do everything you can to have a healthy baby. Two of the most important things are to get good prenatal care and to follow your health care provider's instructions. Prenatal care is all the medical care you receive before the baby's birth. This care will help prevent, find, and treat any problems during the pregnancy and childbirth.  Body changes during your first trimester  Your body goes through many changes during pregnancy. The changes vary from woman to woman.   You may gain or lose a couple of pounds at first.   You may feel sick to your stomach (nauseous) and you may throw up (vomit). If the vomiting is uncontrollable, call your health care provider.   You may tire easily.   You may develop headaches that can be relieved by medicines. All medicines should be approved by your health care provider.   You may urinate more often. Painful urination may mean you have a bladder infection.   You may develop heartburn as a result of your pregnancy.   You may develop constipation because certain hormones are causing the muscles that push stool through your intestines to slow down.   You may develop hemorrhoids or swollen veins (varicose veins).   Your breasts may begin to grow larger and become tender. Your nipples may stick out more, and the tissue that surrounds them (areola) may become darker.   Your gums may bleed and may be  sensitive to brushing and flossing.   Dark spots or blotches (chloasma, mask of pregnancy) may develop on your face. This will likely fade after the baby is born.   Your menstrual periods will stop.   You may have a loss of appetite.   You may develop cravings for certain kinds of food.   You may have changes in your emotions from day to day, such as being excited to be pregnant or being concerned that something may go wrong with the pregnancy and baby.   You may have more vivid and strange dreams.   You may have changes in your hair. These can include thickening of your hair, rapid growth, and changes in texture. Some women also have hair loss during or after pregnancy, or hair that feels dry or thin. Your hair will most likely return to normal after your baby is born.  What to expect at prenatal visits  During a routine prenatal visit:   You will be weighed to make sure you and the baby are growing normally.   Your blood pressure will be taken.   Your abdomen will be measured to track your baby's growth.   The fetal heartbeat will be listened to between weeks 10 and 14 of your pregnancy.   Test results from any previous visits will be discussed.  Your health care provider may ask you:     How you are feeling.   If you are feeling the baby move.   If you have had any abnormal symptoms, such as leaking fluid, bleeding, severe headaches, or abdominal cramping.   If you are using any tobacco products, including cigarettes, chewing tobacco, and electronic cigarettes.   If you have any questions.  Other tests that may be performed during your first trimester include:   Blood tests to find your blood type and to check for the presence of any previous infections. The tests will also be used to check for low iron levels (anemia) and protein on red blood cells (Rh antibodies). Depending on your risk factors, or if you previously had diabetes during pregnancy, you may have tests to check for high blood sugar  that affects pregnant women (gestational diabetes).   Urine tests to check for infections, diabetes, or protein in the urine.   An ultrasound to confirm the proper growth and development of the baby.   Fetal screens for spinal cord problems (spina bifida) and Down syndrome.   HIV (human immunodeficiency virus) testing. Routine prenatal testing includes screening for HIV, unless you choose not to have this test.   You may need other tests to make sure you and the baby are doing well.  Follow these instructions at home:  Medicines   Follow your health care provider's instructions regarding medicine use. Specific medicines may be either safe or unsafe to take during pregnancy.   Take a prenatal vitamin that contains at least 600 micrograms (mcg) of folic acid.   If you develop constipation, try taking a stool softener if your health care provider approves.  Eating and drinking     Eat a balanced diet that includes fresh fruits and vegetables, whole grains, good sources of protein such as meat, eggs, or tofu, and low-fat dairy. Your health care provider will help you determine the amount of weight gain that is right for you.   Avoid raw meat and uncooked cheese. These carry germs that can cause birth defects in the baby.   Eating four or five small meals rather than three large meals a day may help relieve nausea and vomiting. If you start to feel nauseous, eating a few soda crackers can be helpful. Drinking liquids between meals, instead of during meals, also seems to help ease nausea and vomiting.   Limit foods that are high in fat and processed sugars, such as fried and sweet foods.   To prevent constipation:  ? Eat foods that are high in fiber, such as fresh fruits and vegetables, whole grains, and beans.  ? Drink enough fluid to keep your urine clear or pale yellow.  Activity   Exercise only as directed by your health care provider. Most women can continue their usual exercise routine during  pregnancy. Try to exercise for 30 minutes at least 5 days a week. Exercising will help you:  ? Control your weight.  ? Stay in shape.  ? Be prepared for labor and delivery.   Experiencing pain or cramping in the lower abdomen or lower back is a good sign that you should stop exercising. Check with your health care provider before continuing with normal exercises.   Try to avoid standing for long periods of time. Move your legs often if you must stand in one place for a long time.   Avoid heavy lifting.   Wear low-heeled shoes and practice good posture.   You may continue to have sex unless your health care   provider tells you not to.  Relieving pain and discomfort   Wear a good support bra to relieve breast tenderness.   Take warm sitz baths to soothe any pain or discomfort caused by hemorrhoids. Use hemorrhoid cream if your health care provider approves.   Rest with your legs elevated if you have leg cramps or low back pain.   If you develop varicose veins in your legs, wear support hose. Elevate your feet for 15 minutes, 3-4 times a day. Limit salt in your diet.  Prenatal care   Schedule your prenatal visits by the twelfth week of pregnancy. They are usually scheduled monthly at first, then more often in the last 2 months before delivery.   Write down your questions. Take them to your prenatal visits.   Keep all your prenatal visits as told by your health care provider. This is important.  Safety   Wear your seat belt at all times when driving.   Make a list of emergency phone numbers, including numbers for family, friends, the hospital, and police and fire departments.  General instructions   Ask your health care provider for a referral to a local prenatal education class. Begin classes no later than the beginning of month 6 of your pregnancy.   Ask for help if you have counseling or nutritional needs during pregnancy. Your health care provider can offer advice or refer you to specialists for help  with various needs.   Do not use hot tubs, steam rooms, or saunas.   Do not douche or use tampons or scented sanitary pads.   Do not cross your legs for long periods of time.   Avoid cat litter boxes and soil used by cats. These carry germs that can cause birth defects in the baby and possibly loss of the fetus by miscarriage or stillbirth.   Avoid all smoking, herbs, alcohol, and medicines not prescribed by your health care provider. Chemicals in these products affect the formation and growth of the baby.   Do not use any products that contain nicotine or tobacco, such as cigarettes and e-cigarettes. If you need help quitting, ask your health care provider. You may receive counseling support and other resources to help you quit.   Schedule a dentist appointment. At home, brush your teeth with a soft toothbrush and be gentle when you floss.  Contact a health care provider if:   You have dizziness.   You have mild pelvic cramps, pelvic pressure, or nagging pain in the abdominal area.   You have persistent nausea, vomiting, or diarrhea.   You have a bad smelling vaginal discharge.   You have pain when you urinate.   You notice increased swelling in your face, hands, legs, or ankles.   You are exposed to fifth disease or chickenpox.   You are exposed to German measles (rubella) and have never had it.  Get help right away if:   You have a fever.   You are leaking fluid from your vagina.   You have spotting or bleeding from your vagina.   You have severe abdominal cramping or pain.   You have rapid weight gain or loss.   You vomit blood or material that looks like coffee grounds.   You develop a severe headache.   You have shortness of breath.   You have any kind of trauma, such as from a fall or a car accident.  Summary   The first trimester of pregnancy is from week 1 until   the end of week 13 (months 1 through 3).   Your body goes through many changes during pregnancy. The changes vary from  woman to woman.   You will have routine prenatal visits. During those visits, your health care provider will examine you, discuss any test results you may have, and talk with you about how you are feeling.  This information is not intended to replace advice given to you by your health care provider. Make sure you discuss any questions you have with your health care provider.  Document Released: 07/24/2001 Document Revised: 07/11/2016 Document Reviewed: 07/11/2016  Elsevier Interactive Patient Education  2019 [employer]

## 2018-11-06 ENCOUNTER — Encounter: Payer: Self-pay | Admitting: Obstetrics and Gynecology

## 2018-11-06 ENCOUNTER — Ambulatory Visit (INDEPENDENT_AMBULATORY_CARE_PROVIDER_SITE_OTHER): Payer: Managed Care, Other (non HMO) | Admitting: Obstetrics and Gynecology

## 2018-11-06 ENCOUNTER — Other Ambulatory Visit: Payer: Self-pay

## 2018-11-06 VITALS — BP 118/68 | Wt 172.0 lb

## 2018-11-06 DIAGNOSIS — Z3401 Encounter for supervision of normal first pregnancy, first trimester: Secondary | ICD-10-CM

## 2018-11-06 DIAGNOSIS — Z3400 Encounter for supervision of normal first pregnancy, unspecified trimester: Secondary | ICD-10-CM

## 2018-11-06 DIAGNOSIS — Z3A11 11 weeks gestation of pregnancy: Secondary | ICD-10-CM

## 2018-11-06 DIAGNOSIS — Z1379 Encounter for other screening for genetic and chromosomal anomalies: Secondary | ICD-10-CM

## 2018-11-06 NOTE — Progress Notes (Signed)
    Routine Prenatal Care Visit  Subjective  Alexandria Owen is a 28 y.o. G1P0000 at [redacted]w[redacted]d being seen today for ongoing prenatal care.  She is currently monitored for the following issues for this low-risk pregnancy and has Heart palpitations and Supervision of normal first pregnancy, antepartum on their problem list.  ----------------------------------------------------------------------------------- Patient reports no complaints.   Contractions: Not present. Vag. Bleeding: None.  Movement: Absent. Denies leaking of fluid.  ----------------------------------------------------------------------------------- The following portions of the patient's history were reviewed and updated as appropriate: allergies, current medications, past family history, past medical history, past social history, past surgical history and problem list. Problem list updated.   Objective  Blood pressure 118/68, weight 172 lb (78 kg), last menstrual period 08/19/2018. Pregravid weight 175 lb (79.4 kg) Total Weight Gain -3 lb (-1.361 kg) Urinalysis:      Fetal Status: Fetal Heart Rate (bpm): 170   Movement: Absent     General:  Alert, oriented and cooperative. Patient is in no acute distress.  Skin: Skin is warm and dry. No rash noted.   Cardiovascular: Normal heart rate noted  Respiratory: Normal respiratory effort, no problems with respiration noted  Abdomen: Soft, gravid, appropriate for gestational age. Pain/Pressure: Absent     Pelvic:  Cervical exam deferred        Extremities: Normal range of motion.     Mental Status: Normal mood and affect. Normal behavior. Normal judgment and thought content.     Assessment   28 y.o. G1P0000 at [redacted]w[redacted]d by  05/26/2019, by Last Menstrual Period presenting for routine prenatal visit  Plan   Pregnancy #1 Problems (from 08/19/18 to present)    Problem Noted Resolved   Supervision of normal first pregnancy, antepartum 09/25/2018 by Rod Can, CNM No   Overview Addendum  11/06/2018  8:16 AM by Homero Fellers, MD    [facility] Prenatal Labs  Dating  LMP=7wk [place] Blood type: O/Positive/-- (02/27 1003)   Genetic Screen 1 Screen:    AFP:     Quad:     NIPS: Antibody:Negative (02/27 1003)  Anatomic [place]  Rubella: 1.42 (02/27 1003) Varicella: Immune  GTT   Third trimester:  RPR: Non Reactive (02/27 1003)   Rhogam  not needed HBsAg: Negative (02/27 1003)   TDaP vaccine   Flu Shot: Oct 2019 HIV: Non Reactive (02/27 1003)   Baby Food   Breast                             GBS:   Contraception  Pap: NIL  CBB     CS/VBAC NA   Support Person Gwyndolyn Saxon              Gestational age appropriate obstetric precautions including but not limited to vaginal bleeding, contractions, leaking of fluid and fetal movement were reviewed in detail with the patient.    Discussed COVID  Return in about 10 weeks (around 01/15/2019) for ROB and [place].  Homero Fellers MD [facility] 11/06/2018, 8:38 AM

## 2018-11-06 NOTE — Patient Instructions (Signed)
 Hello,  Given the current COVID-19 pandemic, our practice is making changes in how we are providing care to our patients. We are limiting in-person visits for the safety of all of our patients.   As a practice, we have met to discuss the best way to minimize visits, but still provide excellent care to our expecting mothers.  We have decided on the following visit structure for low-risk pregnancies.  Initial Pregnancy visit will be conducted as a telephone or web visit.  Between 10-14 weeks  there will be one in-person visit for an ultrasound, lab work, and genetic screening. 20 weeks in-person visit with an anatomy ultrasound  28 weeks in-person office visit for a 1-hour glucose test and a TDAP vaccination 32 weeks in-person office visit 34 weeks telephone visit 36 weeks in-person office visit 38 weeks in-person office visit 40 weeks in-person office visit  Understandably, some patients will require more visits than what is outlined above. Additional visits will be determined on a case-by-case basis.   We will, as always, be available for emergencies or to address concerns that might arise between in-person visits. We ask that you allow us the opportunity to address any concerns over the phone or through a virtual visit first. We will be available to return your phone calls throughout the day.   If you are able to purchase a scale, a blood pressure machine, and a home fetal doppler visits could be limited further. This will help decrease your exposure risks, but these purchases are not a necessity.   Things seem to change daily and there is the possibility that this structure could change, please be patient as we adapt to a new way of caring for patients.   Thank you for trusting us with your prenatal care. Our practice values you and looks forward to providing you with excellent care.   Sincerely,   [facility], [facility]    COVID-19 and Your Pregnancy FAQ   How can I prevent infection with COVID-19 during my pregnancy? Social distancing is key. Please limit any interactions in public. Try and work from home if possible. Frequently wash your hands after touching possibly contaminated surfaces. Avoid touching your face.  Minimize trips to the store. Consider online ordering when possible.   Should I wear a mask? Masks should only be worn by those experiencing symptoms of COVID-19 or those with confirmed COVID-19 when they are in public or around other individuals.  What are the symptoms of COVID-19? Fever (greater than 100.4 F), dry cough, shortness of breath.  Am I more at risk for COVID-19 since I am pregnant? There is not currently data showing that pregnant women are more adversely impacted by COVID-19 than the general population. However, we know that pregnant women tend to have worse respiratory complications from similar diseases such as the flu and SARS and for this reason should be considered an at-risk population.  What do I do if I am experiencing the symptoms of COVID-19? Testing is being limited because of test availability. If you are experiencing symptoms you should quarantine yourself, and the members of your family, for at least 2 weeks at home.   Please visit this website for more information: https://www.cdc.gov/coronavirus/2019-ncov/if-you-are-sick/steps-when-sick.html  When should I go to the Emergency Room? Please go to the emergency room if you are experiencing ANY of these symptoms*:  1.    Difficulty breathing or shortness of breath 2.    Persistent pain or pressure in the chest   3.    Confusion or difficulty being aroused (or awakened) 4.    Bluish lips or face  *This list is not all inclusive. Please consult our office for any other symptoms that are severe or concerning.  What do I do if I am having difficulty breathing? You should go to the Emergency Room for evaluation. At this time they have a tent set up for  evaluating patients with COVID-19 symptoms.   How will my prenatal care be different because of the COVID-19 pandemic? It has been recommended to reduce the frequency of face-to-face visits and use resources such as telephone and virtual visits when possible. Using a scale, blood pressure machine and fetal doppler at home can further help reduce face-to-face visits. You will be provided with additional information on this topic.  We ask that you come to your visits alone to minimize potential exposures to  COVID-19.  How can I receive childbirth education? At this time in-person classes have been cancelled. You can register for online childbirth education, breastfeeding, and newborn care classes.  Please visit:  [email or web address] for more information  How will my hospital birth experience be different? The hospital is currently limiting visitors. This means that while you are in labor you can only have one person at the hospital with you. Additional family members will not be allowed to wait in the building or outside your room. Your one support person can be the father of the baby, a relative, a doula, or a friend. Once one support person is designated that person will wear a band. This band cannot be shared with multiple people.  How long will I stay in the hospital for after giving birth? It is also recommended that discharge home be expedited during the COVID-19 outbreak. This means staying for 1 day after a vaginal delivery and 2 days after a cesarean section.  What if I have COVID-19 and I am in labor? We ask that you wear a mask while on labor and delivery. We will try and accommodate you being placed in a room that is capable of filtering the air. Please call ahead if you are in labor and on your way to the hospital. The phone number for labor and delivery at [facility] is [phone].  If I have COVID-19 when my baby is born how can I prevent my baby  from contracting COVID-19? This is an issue that will have to be discussed on a case-by-case basis. Current recommendations suggest providing separate isolation rooms for both the mother and new infant as well as limiting visitors. However, there are practical challenges to this recommendation. The situation will assuredly change and decisions will be influenced by the desires of the mother and availability of space.  Some suggestions are the use of a curtain or physical barrier between mom and infant, hand hygiene, mom wearing a mask, or 6 feet of spacing between a mom and infant.   Can I breastfeed during the COVID-19 pandemic?   Yes, breastfeeding is encouraged.  Can I breastfeed if I have COVID-19? Yes. Covid-19 has not been found in breast milk. This means you cannot give COVID-19 to your child through breast milk. Breast feeding will also help pass antibodies to fight infection to your baby.   What precautions should I take when breastfeeding if I have COVID-19? If a mother and newborn do room-in and the mother wishes to feed at the breast, she should put on a facemask and practice hand   hygiene before each feeding.  What precautions should I take when pumping if I have COVID-19? Prior to expressing breast milk, mothers should practice hand hygiene. After each pumping session, all parts that come into contact with breast milk should be thoroughly washed and the entire pump should be appropriately disinfected per the manufacturer's instructions. This expressed breast milk should be fed to the newborn by a healthy caregiver.  What if I am pregnant and work in healthcare? Based on limited data regarding COVID-19 and pregnancy, ACOG currently does not propose creating additional restrictions on pregnant health care personnel because of COVID-19 alone. Pregnant women do not appear to be at higher risk of severe disease related to COVID-19. Pregnant health care personnel should follow CDC risk  assessment and infection control guidelines for health care personnel exposed to patients with suspected or confirmed COVID-19. Adherence to recommended infection prevention and control practices is an important part of protecting all health care personnel in health care settings.    Information on COVID-19 in pregnancy is very limited; however, facilities may want to consider limiting exposure of pregnant health care personnel to patients with confirmed or suspected COVID-19 infection, especially during higher-risk procedures (eg, aerosol-generating procedures), if feasible, based on staffing availability.

## 2018-11-06 NOTE — Progress Notes (Signed)
ROB/Labs C/o about hemorrhage on U/S last time

## 2018-11-11 LAB — MATERNIT 21 PLUS CORE, BLOOD
Result (T21): NEGATIVE
TRISOMY 13: NEGATIVE
TRISOMY 21: NEGATIVE
Trisomy 18 (Edwards syndrome): NEGATIVE

## 2018-11-13 NOTE — Progress Notes (Signed)
Normal XY, called and left message to check mychart.

## 2019-01-12 ENCOUNTER — Other Ambulatory Visit: Payer: Self-pay

## 2019-01-12 ENCOUNTER — Ambulatory Visit (INDEPENDENT_AMBULATORY_CARE_PROVIDER_SITE_OTHER): Payer: Managed Care, Other (non HMO)

## 2019-01-12 ENCOUNTER — Encounter: Payer: Self-pay | Admitting: Obstetrics & Gynecology

## 2019-01-12 ENCOUNTER — Ambulatory Visit (INDEPENDENT_AMBULATORY_CARE_PROVIDER_SITE_OTHER): Payer: Managed Care, Other (non HMO) | Admitting: Obstetrics & Gynecology

## 2019-01-12 VITALS — BP 120/80 | Wt 180.0 lb

## 2019-01-12 DIAGNOSIS — Z3A20 20 weeks gestation of pregnancy: Secondary | ICD-10-CM

## 2019-01-12 DIAGNOSIS — Z3400 Encounter for supervision of normal first pregnancy, unspecified trimester: Secondary | ICD-10-CM

## 2019-01-12 DIAGNOSIS — Z3402 Encounter for supervision of normal first pregnancy, second trimester: Secondary | ICD-10-CM

## 2019-01-12 DIAGNOSIS — Z363 Encounter for antenatal screening for malformations: Secondary | ICD-10-CM | POA: Diagnosis not present

## 2019-01-12 NOTE — Addendum Note (Signed)
Addended by: Gae Dry on: 01/12/2019 01:48 PM   Modules accepted: Miquel Dunn

## 2019-01-12 NOTE — Progress Notes (Signed)
  Subjective  Fetal Movement? yes Contractions? no Leaking Fluid? no Vaginal Bleeding? no No nausea Objective  BP 120/80   Wt 180 lb (81.6 kg)   LMP 08/19/2018 (Exact Date)   BMI 27.37 kg/m  General: NAD Pumonary: no increased work of breathing Abdomen: gravid, non-tender Extremities: no edema Psychiatric: mood appropriate, affect full  Assessment  28 y.o. G1P0000 at [redacted]w[redacted]d by  05/26/2019, by Last Menstrual Period presenting for routine prenatal visit  Plan   Problem List Items Addressed This Visit      Other   Supervision of normal first pregnancy, antepartum    Other Visit Diagnoses    [redacted] weeks gestation of pregnancy    -  Primary      Pregnancy #1 Problems (from 08/19/18 to present)    Problem Noted Resolved   Supervision of normal first pregnancy, antepartum 09/25/2018 by Rod Can, CNM No   Overview Addendum 01/12/2019  9:58 AM by Gae Dry, MD    [facility]inic [facility] Prenatal Labs  Dating  LMP=7wk [place] Blood type: O/Positive/-- (02/27 1003)   Genetic Screen      NIPS:nml XY Antibody:Negative (02/27 1003)  Anatomic [place] [facility] normal Rubella: 1.42 (02/27 1003) Varicella: Immune  GTT   Third trimester:  RPR: Non Reactive (02/27 1003)   Rhogam  not needed HBsAg: Negative (02/27 1003)   TDaP vaccine   Flu Shot: Oct 2019 HIV: Non Reactive (02/27 1003)   Baby Food   Breast                             GBS:   Contraception  Pap: NIL  CBB  No   CS/VBAC NA   Support Person William            Review of ULTRASOUND. I have personally reviewed images and report of recent ultrasound done at [facility]. There is a singleton gestation with subjectively normal amniotic fluid volume. The fetal biometry correlates with established dating. Detailed evaluation of the fetal anatomy was performed.The fetal anatomical survey appears within normal limits within the resolution of ultrasound as described above.  It must be noted that a normal ultrasound is unable to rule out fetal  aneuploidy.    Barnett Applebaum, MD, [facility], [facility] 01/12/2019  10:06 AM

## 2019-01-12 NOTE — Patient Instructions (Signed)

## 2019-01-15 ENCOUNTER — Ambulatory Visit: Payer: Managed Care, Other (non HMO)

## 2019-01-15 ENCOUNTER — Encounter: Payer: Managed Care, Other (non HMO) | Admitting: Obstetrics & Gynecology

## 2019-02-09 ENCOUNTER — Other Ambulatory Visit: Payer: Self-pay

## 2019-02-09 ENCOUNTER — Encounter: Payer: Self-pay | Admitting: Maternal Newborn

## 2019-02-09 ENCOUNTER — Ambulatory Visit (INDEPENDENT_AMBULATORY_CARE_PROVIDER_SITE_OTHER): Payer: Managed Care, Other (non HMO) | Admitting: Maternal Newborn

## 2019-02-09 DIAGNOSIS — Z3402 Encounter for supervision of normal first pregnancy, second trimester: Secondary | ICD-10-CM

## 2019-02-09 DIAGNOSIS — Z3A24 24 weeks gestation of pregnancy: Secondary | ICD-10-CM

## 2019-02-09 DIAGNOSIS — Z3400 Encounter for supervision of normal first pregnancy, unspecified trimester: Secondary | ICD-10-CM

## 2019-02-09 NOTE — Progress Notes (Signed)
ROB- no concerns 

## 2019-02-09 NOTE — Progress Notes (Signed)
Virtual Visit via Telephone Note  I connected with Alexandria Owen on 02/09/19 at  8:10 AM EDT by telephone and verified that I am speaking with the correct person using two identifiers.  Location: Patient: Residence Provider: Office   I discussed the limitations, risks, security and privacy concerns of performing an evaluation and management service by telephone and the availability of in person appointments. The patient expressed understanding and agreed to proceed.  See note below for documentation of the prenatal telephone visit:   Subjective  Alexandria Owen is a 28 y.o. G1P0000 at [redacted]w[redacted]d being seen today for ongoing prenatal care.  She is currently monitored for the following issues for this low-risk pregnancy and has Heart palpitations and Supervision of normal first pregnancy, antepartum on their problem list.  ----------------------------------------------------------------------------------- Patient reports mild heartburn.  Contractions: Not present. Vag. Bleeding: None.  Movement: Present. No leaking of fluid.  ----------------------------------------------------------------------------------- The following portions of the patient's history were reviewed and updated as appropriate: allergies, current medications, past family history, past medical history, past social history, past surgical history and problem list. Problem list updated.   Objective  Last menstrual period 08/19/2018. Pregravid weight 175 lb (79.4 kg) Total Weight Gain 5 lb (2.268 kg)  Fetal Status:     Movement: Present     Physical exam could not be performed as this was a telephone visit done during COVID-19 precautions.  Assessment   28 y.o. G1P0000 at [redacted]w[redacted]d EDD 05/26/2019, by Last Menstrual Period presenting for a prenatal telephone visit.  Plan   Pregnancy #1 Problems (from 08/19/18 to present)    Problem Noted Resolved   Supervision of normal first pregnancy, antepartum 09/25/2018 by Rod Can, CNM No    Overview Addendum 01/12/2019  9:58 AM by Gae Dry, MD    [facility]inic [facility] Prenatal Labs  Dating  LMP=7wk [place] Blood type: O/Positive/-- (02/27 1003)   Genetic Screen      NIPS:nml XY Antibody:Negative (02/27 1003)  Anatomic [place] [facility] normal Rubella: 1.42 (02/27 1003) Varicella: Immune  GTT   Third trimester:  RPR: Non Reactive (02/27 1003)   Rhogam  not needed HBsAg: Negative (02/27 1003)   TDaP vaccine   Flu Shot: Oct 2019 HIV: Non Reactive (02/27 1003)   Baby Food   Breast                             GBS:   Contraception  Pap: NIL  CBB  No   CS/VBAC NA   Support Person Alexandria Owen           Heartburn infrequent; approximately once a week. Uses Tums for relief as needed.   I discussed the assessment and treatment plan with the patient. The patient was provided an opportunity to ask questions and all were answered. The patient agreed with the plan and demonstrated an understanding of the instructions.   The patient was advised to call back or seek an in-person evaluation as needed.  I provided 6:33 minutes of non-face-to-face time during this encounter.  Please refer to After Visit Summary for other counseling recommendations.   Return in about 3 weeks (around 03/02/2019) for ROB and GTT/28 week labs.  [name], CNM 02/09/2019  8:24 AM

## 2019-02-09 NOTE — Patient Instructions (Signed)

## 2019-03-03 ENCOUNTER — Other Ambulatory Visit: Payer: Managed Care, Other (non HMO)

## 2019-03-03 ENCOUNTER — Ambulatory Visit (INDEPENDENT_AMBULATORY_CARE_PROVIDER_SITE_OTHER): Payer: Managed Care, Other (non HMO) | Admitting: Certified Nurse Midwife

## 2019-03-03 ENCOUNTER — Other Ambulatory Visit: Payer: Self-pay

## 2019-03-03 VITALS — BP 102/64 | Wt 185.0 lb

## 2019-03-03 DIAGNOSIS — Z3A28 28 weeks gestation of pregnancy: Secondary | ICD-10-CM

## 2019-03-03 DIAGNOSIS — Z3400 Encounter for supervision of normal first pregnancy, unspecified trimester: Secondary | ICD-10-CM

## 2019-03-03 DIAGNOSIS — Z3403 Encounter for supervision of normal first pregnancy, third trimester: Secondary | ICD-10-CM

## 2019-03-03 NOTE — Progress Notes (Signed)
ROB at 28 weeks: 28 week labs today/ O POS. Good FM. No problems Desires to breast feed. Given Ready, Set, Baby booklet and website for online breast feeding classes. Questions answered regarding online prenatal classes. ROB in 2 weeks  Dalia Heading, CNM

## 2019-03-03 NOTE — Progress Notes (Signed)
ROB/28 week labs No concerns  Denies lof, no vb, Good FM

## 2019-03-04 LAB — 28 WEEK RH+PANEL
Basophils Absolute: 0 10*3/uL (ref 0.0–0.2)
Basos: 0 %
EOS (ABSOLUTE): 0.1 10*3/uL (ref 0.0–0.4)
Eos: 1 %
Gestational Diabetes Screen: 179 mg/dL — ABNORMAL HIGH (ref 65–139)
HIV Screen 4th Generation wRfx: NONREACTIVE
Hematocrit: 36.8 % (ref 34.0–46.6)
Hemoglobin: 12.4 g/dL (ref 11.1–15.9)
Immature Grans (Abs): 0 10*3/uL (ref 0.0–0.1)
Immature Granulocytes: 0 %
Lymphocytes Absolute: 1.6 10*3/uL (ref 0.7–3.1)
Lymphs: 15 %
MCH: 29.7 pg (ref 26.6–33.0)
MCHC: 33.7 g/dL (ref 31.5–35.7)
MCV: 88 fL (ref 79–97)
Monocytes Absolute: 0.6 10*3/uL (ref 0.1–0.9)
Monocytes: 6 %
Neutrophils Absolute: 8.5 10*3/uL — ABNORMAL HIGH (ref 1.4–7.0)
Neutrophils: 78 %
Platelets: 268 10*3/uL (ref 150–450)
RBC: 4.17 x10E6/uL (ref 3.77–5.28)
RDW: 13 % (ref 11.7–15.4)
RPR Ser Ql: NONREACTIVE
WBC: 10.7 10*3/uL (ref 3.4–10.8)

## 2019-03-05 ENCOUNTER — Other Ambulatory Visit: Payer: Self-pay | Admitting: Maternal Newborn

## 2019-03-05 DIAGNOSIS — R7309 Other abnormal glucose: Secondary | ICD-10-CM

## 2019-03-05 NOTE — Progress Notes (Signed)
3 hour GTT ordered, patient aware to schedule lab visit.

## 2019-03-11 ENCOUNTER — Other Ambulatory Visit: Payer: Self-pay

## 2019-03-11 ENCOUNTER — Other Ambulatory Visit: Payer: Managed Care, Other (non HMO)

## 2019-03-11 DIAGNOSIS — R7309 Other abnormal glucose: Secondary | ICD-10-CM

## 2019-03-12 LAB — GESTATIONAL GLUCOSE TOLERANCE
Glucose, Fasting: 84 mg/dL (ref 65–94)
Glucose, GTT - 1 Hour: 162 mg/dL (ref 65–179)
Glucose, GTT - 2 Hour: 135 mg/dL (ref 65–154)
Glucose, GTT - 3 Hour: 95 mg/dL (ref 65–139)

## 2019-03-17 ENCOUNTER — Ambulatory Visit (INDEPENDENT_AMBULATORY_CARE_PROVIDER_SITE_OTHER): Payer: Managed Care, Other (non HMO) | Admitting: Maternal Newborn

## 2019-03-17 ENCOUNTER — Other Ambulatory Visit: Payer: Self-pay

## 2019-03-17 VITALS — BP 90/70 | Wt 183.8 lb

## 2019-03-17 DIAGNOSIS — Z3403 Encounter for supervision of normal first pregnancy, third trimester: Secondary | ICD-10-CM

## 2019-03-17 DIAGNOSIS — Z3400 Encounter for supervision of normal first pregnancy, unspecified trimester: Secondary | ICD-10-CM

## 2019-03-17 DIAGNOSIS — Z3A30 30 weeks gestation of pregnancy: Secondary | ICD-10-CM

## 2019-03-17 DIAGNOSIS — Z23 Encounter for immunization: Secondary | ICD-10-CM

## 2019-03-17 LAB — POCT URINALYSIS DIPSTICK OB
Glucose, UA: NEGATIVE
POC,PROTEIN,UA: NEGATIVE

## 2019-03-17 NOTE — Patient Instructions (Signed)
Third Trimester of Pregnancy The third trimester is from week 28 through week 40 (months 7 through 9). The third trimester is a time when the unborn baby (fetus) is growing rapidly. At the end of the ninth month, the fetus is about 20 inches in length and weighs 6-10 pounds. Body changes during your third trimester Your body will continue to go through many changes during pregnancy. The changes vary from woman to woman. During the third trimester:  Your weight will continue to increase. You can expect to gain 25-35 pounds (11-16 kg) by the end of the pregnancy.  You may begin to get stretch marks on your hips, abdomen, and breasts.  You may urinate more often because the fetus is moving lower into your pelvis and pressing on your bladder.  You may develop or continue to have heartburn. This is caused by increased hormones that slow down muscles in the digestive tract.  You may develop or continue to have constipation because increased hormones slow digestion and cause the muscles that push waste through your intestines to relax.  You may develop hemorrhoids. These are swollen veins (varicose veins) in the rectum that can itch or be painful.  You may develop swollen, bulging veins (varicose veins) in your legs.  You may have increased body aches in the pelvis, back, or thighs. This is due to weight gain and increased hormones that are relaxing your joints.  You may have changes in your hair. These can include thickening of your hair, rapid growth, and changes in texture. Some women also have hair loss during or after pregnancy, or hair that feels dry or thin. Your hair will most likely return to normal after your baby is born.  Your breasts will continue to grow and they will continue to become tender. A yellow fluid (colostrum) may leak from your breasts. This is the first milk you are producing for your baby.  Your belly button may stick out.  You may notice more swelling in your hands,  face, or ankles.  You may have increased tingling or numbness in your hands, arms, and legs. The skin on your belly may also feel numb.  You may feel short of breath because of your expanding uterus.  You may have more problems sleeping. This can be caused by the size of your belly, increased need to urinate, and an increase in your body's metabolism.  You may notice the fetus "dropping," or moving lower in your abdomen (lightening).  You may have increased vaginal discharge.  You may notice your joints feel loose and you may have pain around your pelvic bone. What to expect at prenatal visits You will have prenatal exams every 2 weeks until week 36. Then you will have weekly prenatal exams. During a routine prenatal visit:  You will be weighed to make sure you and the baby are growing normally.  Your blood pressure will be taken.  Your abdomen will be measured to track your baby's growth.  The fetal heartbeat will be listened to.  Any test results from the previous visit will be discussed.  You may have a cervical check near your due date to see if your cervix has softened or thinned (effaced).  You will be tested for Group B streptococcus. This happens between 35 and 37 weeks. Your health care provider may ask you:  What your birth plan is.  How you are feeling.  If you are feeling the baby move.  If you have had any abnormal   symptoms, such as leaking fluid, bleeding, severe headaches, or abdominal cramping.  If you are using any tobacco products, including cigarettes, chewing tobacco, and electronic cigarettes.  If you have any questions. Other tests or screenings that may be performed during your third trimester include:  Blood tests that check for low iron levels (anemia).  Fetal testing to check the health, activity level, and growth of the fetus. Testing is done if you have certain medical conditions or if there are problems during the pregnancy.  Nonstress test  (NST). This test checks the health of your baby to make sure there are no signs of problems, such as the baby not getting enough oxygen. During this test, a belt is placed around your belly. The baby is made to move, and its heart rate is monitored during movement. What is false labor? False labor is a condition in which you feel small, irregular tightenings of the muscles in the womb (contractions) that usually go away with rest, changing position, or drinking water. These are called Braxton Hicks contractions. Contractions may last for hours, days, or even weeks before true labor sets in. If contractions come at regular intervals, become more frequent, increase in intensity, or become painful, you should see your health care provider. What are the signs of labor?  Abdominal cramps.  Regular contractions that start at 10 minutes apart and become stronger and more frequent with time.  Contractions that start on the top of the uterus and spread down to the lower abdomen and back.  Increased pelvic pressure and dull back pain.  A watery or bloody mucus discharge that comes from the vagina.  Leaking of amniotic fluid. This is also known as your "water breaking." It could be a slow trickle or a gush. Let your health care provider know if it has a color or strange odor. If you have any of these signs, call your health care provider right away, even if it is before your due date. Follow these instructions at home: Medicines  Follow your health care provider's instructions regarding medicine use. Specific medicines may be either safe or unsafe to take during pregnancy.  Take a prenatal vitamin that contains at least 600 micrograms (mcg) of folic acid.  If you develop constipation, try taking a stool softener if your health care provider approves. Eating and drinking   Eat a balanced diet that includes fresh fruits and vegetables, whole grains, good sources of protein such as meat, eggs, or tofu,  and low-fat dairy. Your health care provider will help you determine the amount of weight gain that is right for you.  Avoid raw meat and uncooked cheese. These carry germs that can cause birth defects in the baby.  If you have low calcium intake from food, talk to your health care provider about whether you should take a daily calcium supplement.  Eat four or five small meals rather than three large meals a day.  Limit foods that are high in fat and processed sugars, such as fried and sweet foods.  To prevent constipation: ? Drink enough fluid to keep your urine clear or pale yellow. ? Eat foods that are high in fiber, such as fresh fruits and vegetables, whole grains, and beans. Activity  Exercise only as directed by your health care provider. Most women can continue their usual exercise routine during pregnancy. Try to exercise for 30 minutes at least 5 days a week. Stop exercising if you experience uterine contractions.  Avoid heavy lifting.  Do   not exercise in extreme heat or humidity, or at high altitudes.  Wear low-heel, comfortable shoes.  Practice good posture.  You may continue to have sex unless your health care provider tells you otherwise. Relieving pain and discomfort  Take frequent breaks and rest with your legs elevated if you have leg cramps or low back pain.  Take warm sitz baths to soothe any pain or discomfort caused by hemorrhoids. Use hemorrhoid cream if your health care provider approves.  Wear a good support bra to prevent discomfort from breast tenderness.  If you develop varicose veins: ? Wear support pantyhose or compression stockings as told by your healthcare provider. ? Elevate your feet for 15 minutes, 3-4 times a day. Prenatal care  Write down your questions. Take them to your prenatal visits.  Keep all your prenatal visits as told by your health care provider. This is important. Safety  Wear your seat belt at all times when driving.  Make  a list of emergency phone numbers, including numbers for family, friends, the hospital, and police and fire departments. General instructions  Avoid cat litter boxes and soil used by cats. These carry germs that can cause birth defects in the baby. If you have a cat, ask someone to clean the litter box for you.  Do not travel far distances unless it is absolutely necessary and only with the approval of your health care provider.  Do not use hot tubs, steam rooms, or saunas.  Do not drink alcohol.  Do not use any products that contain nicotine or tobacco, such as cigarettes and e-cigarettes. If you need help quitting, ask your health care provider.  Do not use any medicinal herbs or unprescribed drugs. These chemicals affect the formation and growth of the baby.  Do not douche or use tampons or scented sanitary pads.  Do not cross your legs for long periods of time.  To prepare for the arrival of your baby: ? Take prenatal classes to understand, practice, and ask questions about labor and delivery. ? Make a trial run to the hospital. ? Visit the hospital and tour the maternity area. ? Arrange for maternity or paternity leave through employers. ? Arrange for family and friends to take care of pets while you are in the hospital. ? Purchase a rear-facing car seat and make sure you know how to install it in your car. ? Pack your hospital bag. ? Prepare the baby's nursery. Make sure to remove all pillows and stuffed animals from the baby's crib to prevent suffocation.  Visit your dentist if you have not gone during your pregnancy. Use a soft toothbrush to brush your teeth and be gentle when you floss. Contact a health care provider if:  You are unsure if you are in labor or if your water has broken.  You become dizzy.  You have mild pelvic cramps, pelvic pressure, or nagging pain in your abdominal area.  You have lower back pain.  You have persistent nausea, vomiting, or diarrhea.   You have an unusual or bad smelling vaginal discharge.  You have pain when you urinate. Get help right away if:  Your water breaks before 37 weeks.  You have regular contractions less than 5 minutes apart before 37 weeks.  You have a fever.  You are leaking fluid from your vagina.  You have spotting or bleeding from your vagina.  You have severe abdominal pain or cramping.  You have rapid weight loss or weight gain.  You have  shortness of breath with chest pain.  You notice sudden or extreme swelling of your face, hands, ankles, feet, or legs.  Your baby makes fewer than 10 movements in 2 hours.  You have severe headaches that do not go away when you take medicine.  You have vision changes. Summary  The third trimester is from week 28 through week 40, months 7 through 9. The third trimester is a time when the unborn baby (fetus) is growing rapidly.  During the third trimester, your discomfort may increase as you and your baby continue to gain weight. You may have abdominal, leg, and back pain, sleeping problems, and an increased need to urinate.  During the third trimester your breasts will keep growing and they will continue to become tender. A yellow fluid (colostrum) may leak from your breasts. This is the first milk you are producing for your baby.  False labor is a condition in which you feel small, irregular tightenings of the muscles in the womb (contractions) that eventually go away. These are called Braxton Hicks contractions. Contractions may last for hours, days, or even weeks before true labor sets in.  Signs of labor can include: abdominal cramps; regular contractions that start at 10 minutes apart and become stronger and more frequent with time; watery or bloody mucus discharge that comes from the vagina; increased pelvic pressure and dull back pain; and leaking of amniotic fluid. This information is not intended to replace advice given to you by your health  care provider. Make sure you discuss any questions you have with your health care provider. Document Released: 07/24/2001 Document Revised: 11/20/2018 Document Reviewed: 09/04/2016 Elsevier Patient Education  2020 [employer].

## 2019-03-17 NOTE — Progress Notes (Signed)
    Routine Prenatal Care Visit  Subjective  Alexandria Owen is a 28 y.o. G1P0000 at [redacted]w[redacted]d being seen today for ongoing prenatal care.  She is currently monitored for the following issues for this low-risk pregnancy and has Heart palpitations and Supervision of normal first pregnancy, antepartum on their problem list.  ----------------------------------------------------------------------------------- Patient reports no complaints.   Contractions: Not present. Vag. Bleeding: None.  Movement: Present. No leaking of fluid.  ----------------------------------------------------------------------------------- The following portions of the patient's history were reviewed and updated as appropriate: allergies, current medications, past family history, past medical history, past social history, past surgical history and problem list. Problem list updated.  Objective  Blood pressure 90/70, weight 183 lb 12.8 oz (83.4 kg), last menstrual period 08/19/2018. Pregravid weight 175 lb (79.4 kg) Total Weight Gain 8 lb 12.8 oz (3.992 kg) Urinalysis: Urine dipstick shows negative for glucose, protein.  Fetal Status: Fetal Heart Rate (bpm): 152 Fundal Height: 30 cm Movement: Present     General:  Alert, oriented and cooperative. Patient is in no acute distress.  Skin: Skin is warm and Owen. No rash noted.   Cardiovascular: Normal heart rate noted  Respiratory: Normal respiratory effort, no problems with respiration noted  Abdomen: Soft, gravid, appropriate for gestational age. Pain/Pressure: Absent     Pelvic:  Cervical exam deferred        Extremities: Normal range of motion.  Edema: None  Mental Status: Normal mood and affect. Normal behavior. Normal judgment and thought content.     Assessment   28 y.o. G1P0000 at [redacted]w[redacted]d, EDD 05/26/2019 by Last Menstrual Period presenting for a routine prenatal visit.  Plan   Pregnancy #1 Problems (from 08/19/18 to present)    Problem Noted Resolved   Supervision of  normal first pregnancy, antepartum 09/25/2018 by Alexandria Owen, CNM No   Overview Addendum 01/12/2019  9:58 AM by Alexandria Dry, MD    Clinic [facility] Prenatal Labs  Dating  LMP=7wk [place] Blood type: O/Positive/-- (02/27 1003)   Genetic Screen      NIPS:nml XY Antibody:Negative (02/27 1003)  Anatomic [place] [facility] normal Rubella: 1.42 (02/27 1003) Varicella: Immune  GTT   Third trimester:  RPR: Non Reactive (02/27 1003)   Rhogam  not needed HBsAg: Negative (02/27 1003)   TDaP vaccine   Flu Shot: Oct 2019 HIV: Non Reactive (02/27 1003)   Baby Food   Breast                             GBS:   Contraception  Pap: NIL  CBB  No   CS/VBAC NA   Support Person Alexandria Owen           TDaP vaccine accepted today.   Please refer to After Visit Summary for other counseling recommendations.   Return in about 2 weeks (around 03/31/2019) for [place].  Alexandria Owen, CNM 03/17/2019  9:03 AM

## 2019-03-17 NOTE — Progress Notes (Signed)
ROB/TDap/BT consent today- no concerns

## 2019-03-31 ENCOUNTER — Encounter: Payer: Self-pay | Admitting: Maternal Newborn

## 2019-03-31 ENCOUNTER — Other Ambulatory Visit: Payer: Self-pay

## 2019-03-31 ENCOUNTER — Ambulatory Visit (INDEPENDENT_AMBULATORY_CARE_PROVIDER_SITE_OTHER): Payer: Managed Care, Other (non HMO) | Admitting: Maternal Newborn

## 2019-03-31 VITALS — BP 100/60 | Wt 187.0 lb

## 2019-03-31 DIAGNOSIS — Z3403 Encounter for supervision of normal first pregnancy, third trimester: Secondary | ICD-10-CM

## 2019-03-31 DIAGNOSIS — Z3A32 32 weeks gestation of pregnancy: Secondary | ICD-10-CM

## 2019-03-31 DIAGNOSIS — Z3400 Encounter for supervision of normal first pregnancy, unspecified trimester: Secondary | ICD-10-CM

## 2019-03-31 LAB — POCT URINALYSIS DIPSTICK OB
Glucose, UA: NEGATIVE
POC,PROTEIN,UA: NEGATIVE

## 2019-03-31 NOTE — Patient Instructions (Signed)
Third Trimester of Pregnancy The third trimester is from week 28 through week 40 (months 7 through 9). The third trimester is a time when the unborn baby (fetus) is growing rapidly. At the end of the ninth month, the fetus is about 20 inches in length and weighs 6-10 pounds. Body changes during your third trimester Your body will continue to go through many changes during pregnancy. The changes vary from woman to woman. During the third trimester:  Your weight will continue to increase. You can expect to gain 25-35 pounds (11-16 kg) by the end of the pregnancy.  You may begin to get stretch marks on your hips, abdomen, and breasts.  You may urinate more often because the fetus is moving lower into your pelvis and pressing on your bladder.  You may develop or continue to have heartburn. This is caused by increased hormones that slow down muscles in the digestive tract.  You may develop or continue to have constipation because increased hormones slow digestion and cause the muscles that push waste through your intestines to relax.  You may develop hemorrhoids. These are swollen veins (varicose veins) in the rectum that can itch or be painful.  You may develop swollen, bulging veins (varicose veins) in your legs.  You may have increased body aches in the pelvis, back, or thighs. This is due to weight gain and increased hormones that are relaxing your joints.  You may have changes in your hair. These can include thickening of your hair, rapid growth, and changes in texture. Some women also have hair loss during or after pregnancy, or hair that feels dry or thin. Your hair will most likely return to normal after your baby is born.  Your breasts will continue to grow and they will continue to become tender. A yellow fluid (colostrum) may leak from your breasts. This is the first milk you are producing for your baby.  Your belly button may stick out.  You may notice more swelling in your hands,  face, or ankles.  You may have increased tingling or numbness in your hands, arms, and legs. The skin on your belly may also feel numb.  You may feel short of breath because of your expanding uterus.  You may have more problems sleeping. This can be caused by the size of your belly, increased need to urinate, and an increase in your body's metabolism.  You may notice the fetus "dropping," or moving lower in your abdomen (lightening).  You may have increased vaginal discharge.  You may notice your joints feel loose and you may have pain around your pelvic bone. What to expect at prenatal visits You will have prenatal exams every 2 weeks until week 36. Then you will have weekly prenatal exams. During a routine prenatal visit:  You will be weighed to make sure you and the baby are growing normally.  Your blood pressure will be taken.  Your abdomen will be measured to track your baby's growth.  The fetal heartbeat will be listened to.  Any test results from the previous visit will be discussed.  You may have a cervical check near your due date to see if your cervix has softened or thinned (effaced).  You will be tested for Group B streptococcus. This happens between 35 and 37 weeks. Your health care provider may ask you:  What your birth plan is.  How you are feeling.  If you are feeling the baby move.  If you have had any abnormal   symptoms, such as leaking fluid, bleeding, severe headaches, or abdominal cramping.  If you are using any tobacco products, including cigarettes, chewing tobacco, and electronic cigarettes.  If you have any questions. Other tests or screenings that may be performed during your third trimester include:  Blood tests that check for low iron levels (anemia).  Fetal testing to check the health, activity level, and growth of the fetus. Testing is done if you have certain medical conditions or if there are problems during the pregnancy.  Nonstress test  (NST). This test checks the health of your baby to make sure there are no signs of problems, such as the baby not getting enough oxygen. During this test, a belt is placed around your belly. The baby is made to move, and its heart rate is monitored during movement. What is false labor? False labor is a condition in which you feel small, irregular tightenings of the muscles in the womb (contractions) that usually go away with rest, changing position, or drinking water. These are called Braxton Hicks contractions. Contractions may last for hours, days, or even weeks before true labor sets in. If contractions come at regular intervals, become more frequent, increase in intensity, or become painful, you should see your health care provider. What are the signs of labor?  Abdominal cramps.  Regular contractions that start at 10 minutes apart and become stronger and more frequent with time.  Contractions that start on the top of the uterus and spread down to the lower abdomen and back.  Increased pelvic pressure and dull back pain.  A watery or bloody mucus discharge that comes from the vagina.  Leaking of amniotic fluid. This is also known as your "water breaking." It could be a slow trickle or a gush. Let your health care provider know if it has a color or strange odor. If you have any of these signs, call your health care provider right away, even if it is before your due date. Follow these instructions at home: Medicines  Follow your health care provider's instructions regarding medicine use. Specific medicines may be either safe or unsafe to take during pregnancy.  Take a prenatal vitamin that contains at least 600 micrograms (mcg) of folic acid.  If you develop constipation, try taking a stool softener if your health care provider approves. Eating and drinking   Eat a balanced diet that includes fresh fruits and vegetables, whole grains, good sources of protein such as meat, eggs, or tofu,  and low-fat dairy. Your health care provider will help you determine the amount of weight gain that is right for you.  Avoid raw meat and uncooked cheese. These carry germs that can cause birth defects in the baby.  If you have low calcium intake from food, talk to your health care provider about whether you should take a daily calcium supplement.  Eat four or five small meals rather than three large meals a day.  Limit foods that are high in fat and processed sugars, such as fried and sweet foods.  To prevent constipation: ? Drink enough fluid to keep your urine clear or pale yellow. ? Eat foods that are high in fiber, such as fresh fruits and vegetables, whole grains, and beans. Activity  Exercise only as directed by your health care provider. Most women can continue their usual exercise routine during pregnancy. Try to exercise for 30 minutes at least 5 days a week. Stop exercising if you experience uterine contractions.  Avoid heavy lifting.  Do   not exercise in extreme heat or humidity, or at high altitudes.  Wear low-heel, comfortable shoes.  Practice good posture.  You may continue to have sex unless your health care provider tells you otherwise. Relieving pain and discomfort  Take frequent breaks and rest with your legs elevated if you have leg cramps or low back pain.  Take warm sitz baths to soothe any pain or discomfort caused by hemorrhoids. Use hemorrhoid cream if your health care provider approves.  Wear a good support bra to prevent discomfort from breast tenderness.  If you develop varicose veins: ? Wear support pantyhose or compression stockings as told by your healthcare provider. ? Elevate your feet for 15 minutes, 3-4 times a day. Prenatal care  Write down your questions. Take them to your prenatal visits.  Keep all your prenatal visits as told by your health care provider. This is important. Safety  Wear your seat belt at all times when driving.  Make  a list of emergency phone numbers, including numbers for family, friends, the hospital, and police and fire departments. General instructions  Avoid cat litter boxes and soil used by cats. These carry germs that can cause birth defects in the baby. If you have a cat, ask someone to clean the litter box for you.  Do not travel far distances unless it is absolutely necessary and only with the approval of your health care provider.  Do not use hot tubs, steam rooms, or saunas.  Do not drink alcohol.  Do not use any products that contain nicotine or tobacco, such as cigarettes and e-cigarettes. If you need help quitting, ask your health care provider.  Do not use any medicinal herbs or unprescribed drugs. These chemicals affect the formation and growth of the baby.  Do not douche or use tampons or scented sanitary pads.  Do not cross your legs for long periods of time.  To prepare for the arrival of your baby: ? Take prenatal classes to understand, practice, and ask questions about labor and delivery. ? Make a trial run to the hospital. ? Visit the hospital and tour the maternity area. ? Arrange for maternity or paternity leave through employers. ? Arrange for family and friends to take care of pets while you are in the hospital. ? Purchase a rear-facing car seat and make sure you know how to install it in your car. ? Pack your hospital bag. ? Prepare the baby's nursery. Make sure to remove all pillows and stuffed animals from the baby's crib to prevent suffocation.  Visit your dentist if you have not gone during your pregnancy. Use a soft toothbrush to brush your teeth and be gentle when you floss. Contact a health care provider if:  You are unsure if you are in labor or if your water has broken.  You become dizzy.  You have mild pelvic cramps, pelvic pressure, or nagging pain in your abdominal area.  You have lower back pain.  You have persistent nausea, vomiting, or diarrhea.   You have an unusual or bad smelling vaginal discharge.  You have pain when you urinate. Get help right away if:  Your water breaks before 37 weeks.  You have regular contractions less than 5 minutes apart before 37 weeks.  You have a fever.  You are leaking fluid from your vagina.  You have spotting or bleeding from your vagina.  You have severe abdominal pain or cramping.  You have rapid weight loss or weight gain.  You have  shortness of breath with chest pain.  You notice sudden or extreme swelling of your face, hands, ankles, feet, or legs.  Your baby makes fewer than 10 movements in 2 hours.  You have severe headaches that do not go away when you take medicine.  You have vision changes. Summary  The third trimester is from week 28 through week 40, months 7 through 9. The third trimester is a time when the unborn baby (fetus) is growing rapidly.  During the third trimester, your discomfort may increase as you and your baby continue to gain weight. You may have abdominal, leg, and back pain, sleeping problems, and an increased need to urinate.  During the third trimester your breasts will keep growing and they will continue to become tender. A yellow fluid (colostrum) may leak from your breasts. This is the first milk you are producing for your baby.  False labor is a condition in which you feel small, irregular tightenings of the muscles in the womb (contractions) that eventually go away. These are called Braxton Hicks contractions. Contractions may last for hours, days, or even weeks before true labor sets in.  Signs of labor can include: abdominal cramps; regular contractions that start at 10 minutes apart and become stronger and more frequent with time; watery or bloody mucus discharge that comes from the vagina; increased pelvic pressure and dull back pain; and leaking of amniotic fluid. This information is not intended to replace advice given to you by your health  care provider. Make sure you discuss any questions you have with your health care provider. Document Released: 07/24/2001 Document Revised: 11/20/2018 Document Reviewed: 09/04/2016 Elsevier Patient Education  2020 [employer].

## 2019-03-31 NOTE — Progress Notes (Signed)
    Routine Prenatal Care Visit  Subjective  Alexandria Owen is a 28 y.o. G1P0000 at [redacted]w[redacted]d being seen today for ongoing prenatal care.  She is currently monitored for the following issues for this low-risk pregnancy and has Heart palpitations and Supervision of normal first pregnancy, antepartum on their problem list.  ----------------------------------------------------------------------------------- Patient reports no complaints.   Contractions: Not present. Vag. Bleeding: None.  Movement: Present. No leaking of fluid.  ----------------------------------------------------------------------------------- The following portions of the patient's history were reviewed and updated as appropriate: allergies, current medications, past family history, past medical history, past social history, past surgical history and problem list. Problem list updated.   Objective  Blood pressure 100/60, weight 187 lb (84.8 kg), last menstrual period 08/19/2018. Pregravid weight 175 lb (79.4 kg) Total Weight Gain 12 lb (5.443 kg) Urinalysis: Urine dipstick shows negative for glucose, protein.  Fetal Status: Fetal Heart Rate (bpm): 145 Fundal Height: 32 cm Movement: Present     General:  Alert, oriented and cooperative. Patient is in no acute distress.  Skin: Skin is warm and dry. No rash noted.   Cardiovascular: Normal heart rate noted  Respiratory: Normal respiratory effort, no problems with respiration noted  Abdomen: Soft, gravid, appropriate for gestational age. Pain/Pressure: Present     Pelvic:  Cervical exam deferred        Extremities: Normal range of motion.  Edema: None  Mental Status: Normal mood and affect. Normal behavior. Normal judgment and thought content.     Assessment   28 y.o. G1P0000 at [redacted]w[redacted]d, EDD 05/26/2019 by Last Menstrual Period presenting for a routine prenatal visit.  Plan   Pregnancy #1 Problems (from 08/19/18 to present)    Problem Noted Resolved   Supervision of normal  first pregnancy, antepartum 09/25/2018 by Rod Can, CNM No   Overview Addendum 03/17/2019  9:04 AM by Rexene Agent, [facility] Prenatal Labs  Dating  LMP=7wk [place] Blood type: O/Positive/-- (02/27 1003)   Genetic Screen      NIPS:nml XY Antibody:Negative (02/27 1003)  Anatomic [place] [facility] normal Rubella: 1.42 (02/27 1003) Varicella: Immune  GTT   Third trimester:  RPR: Non Reactive (02/27 1003)   Rhogam  not needed HBsAg: Negative (02/27 1003)   TDaP vaccine 03/17/2019  Flu Shot: Oct 2019 HIV: Non Reactive (02/27 1003)   Baby Food   Breast                             GBS:   Contraception  Pap: NIL  CBB  No   CS/VBAC NA   Support Person William             Please refer to After Visit Summary for other counseling recommendations.   Return in about 2 weeks (around 04/14/2019) for ROB.  [name], CNM 03/31/2019  8:21 AM

## 2019-04-14 ENCOUNTER — Other Ambulatory Visit: Payer: Self-pay

## 2019-04-14 ENCOUNTER — Ambulatory Visit (INDEPENDENT_AMBULATORY_CARE_PROVIDER_SITE_OTHER): Payer: Managed Care, Other (non HMO) | Admitting: Certified Nurse Midwife

## 2019-04-14 VITALS — BP 100/70 | Wt 188.4 lb

## 2019-04-14 DIAGNOSIS — Z3400 Encounter for supervision of normal first pregnancy, unspecified trimester: Secondary | ICD-10-CM

## 2019-04-14 DIAGNOSIS — Z3A34 34 weeks gestation of pregnancy: Secondary | ICD-10-CM

## 2019-04-14 DIAGNOSIS — Z3403 Encounter for supervision of normal first pregnancy, third trimester: Secondary | ICD-10-CM

## 2019-04-14 LAB — POCT URINALYSIS DIPSTICK OB
Glucose, UA: NEGATIVE
POC,PROTEIN,UA: NEGATIVE

## 2019-04-14 NOTE — Progress Notes (Signed)
No problems.rj 

## 2019-04-14 NOTE — Progress Notes (Signed)
ROB at 34 weeks : Doing well. Baby active. Having some BH contractions. No vaginal bleeding or leakage of fluid. Plans on breast feeding. Has Ready, Set, Baby booklet. Encouraged to take online breast feeding and childbirth classes. Discussed birth control options while breast feeding Discussed labor signs and symptoms Preregistration encouraged [place] instructions ROB in 2 weeks

## 2019-04-28 ENCOUNTER — Encounter: Payer: Self-pay | Admitting: Maternal Newborn

## 2019-04-28 ENCOUNTER — Ambulatory Visit (INDEPENDENT_AMBULATORY_CARE_PROVIDER_SITE_OTHER): Payer: Managed Care, Other (non HMO) | Admitting: Maternal Newborn

## 2019-04-28 ENCOUNTER — Other Ambulatory Visit: Payer: Self-pay

## 2019-04-28 ENCOUNTER — Other Ambulatory Visit (HOSPITAL_COMMUNITY)
Admission: RE | Admit: 2019-04-28 | Discharge: 2019-04-28 | Disposition: A | Discharge status: Home / Self Care | Payer: Managed Care, Other (non HMO) | Source: Ambulatory Visit | Attending: Maternal Newborn | Admitting: Maternal Newborn

## 2019-04-28 ENCOUNTER — Other Ambulatory Visit: Payer: Self-pay | Admitting: Maternal Newborn

## 2019-04-28 VITALS — BP 102/70 | Wt 189.0 lb

## 2019-04-28 DIAGNOSIS — Z3403 Encounter for supervision of normal first pregnancy, third trimester: Secondary | ICD-10-CM

## 2019-04-28 DIAGNOSIS — Z3400 Encounter for supervision of normal first pregnancy, unspecified trimester: Secondary | ICD-10-CM

## 2019-04-28 DIAGNOSIS — Z3A36 36 weeks gestation of pregnancy: Secondary | ICD-10-CM

## 2019-04-28 LAB — POCT URINALYSIS DIPSTICK OB
Glucose, UA: NEGATIVE
POC,PROTEIN,UA: NEGATIVE

## 2019-04-28 NOTE — Patient Instructions (Signed)
Third Trimester of Pregnancy The third trimester is from week 28 through week 40 (months 7 through 9). The third trimester is a time when the unborn baby (fetus) is growing rapidly. At the end of the ninth month, the fetus is about 20 inches in length and weighs 6-10 pounds. Body changes during your third trimester Your body will continue to go through many changes during pregnancy. The changes vary from woman to woman. During the third trimester:  Your weight will continue to increase. You can expect to gain 25-35 pounds (11-16 kg) by the end of the pregnancy.  You may begin to get stretch marks on your hips, abdomen, and breasts.  You may urinate more often because the fetus is moving lower into your pelvis and pressing on your bladder.  You may develop or continue to have heartburn. This is caused by increased hormones that slow down muscles in the digestive tract.  You may develop or continue to have constipation because increased hormones slow digestion and cause the muscles that push waste through your intestines to relax.  You may develop hemorrhoids. These are swollen veins (varicose veins) in the rectum that can itch or be painful.  You may develop swollen, bulging veins (varicose veins) in your legs.  You may have increased body aches in the pelvis, back, or thighs. This is due to weight gain and increased hormones that are relaxing your joints.  You may have changes in your hair. These can include thickening of your hair, rapid growth, and changes in texture. Some women also have hair loss during or after pregnancy, or hair that feels dry or thin. Your hair will most likely return to normal after your baby is born.  Your breasts will continue to grow and they will continue to become tender. A yellow fluid (colostrum) may leak from your breasts. This is the first milk you are producing for your baby.  Your belly button may stick out.  You may notice more swelling in your hands,  face, or ankles.  You may have increased tingling or numbness in your hands, arms, and legs. The skin on your belly may also feel numb.  You may feel short of breath because of your expanding uterus.  You may have more problems sleeping. This can be caused by the size of your belly, increased need to urinate, and an increase in your body's metabolism.  You may notice the fetus "dropping," or moving lower in your abdomen (lightening).  You may have increased vaginal discharge.  You may notice your joints feel loose and you may have pain around your pelvic bone. What to expect at prenatal visits You will have prenatal exams every 2 weeks until week 36. Then you will have weekly prenatal exams. During a routine prenatal visit:  You will be weighed to make sure you and the baby are growing normally.  Your blood pressure will be taken.  Your abdomen will be measured to track your baby's growth.  The fetal heartbeat will be listened to.  Any test results from the previous visit will be discussed.  You may have a cervical check near your due date to see if your cervix has softened or thinned (effaced).  You will be tested for Group B streptococcus. This happens between 35 and 37 weeks. Your health care provider may ask you:  What your birth plan is.  How you are feeling.  If you are feeling the baby move.  If you have had any abnormal   symptoms, such as leaking fluid, bleeding, severe headaches, or abdominal cramping.  If you are using any tobacco products, including cigarettes, chewing tobacco, and electronic cigarettes.  If you have any questions. Other tests or screenings that may be performed during your third trimester include:  Blood tests that check for low iron levels (anemia).  Fetal testing to check the health, activity level, and growth of the fetus. Testing is done if you have certain medical conditions or if there are problems during the pregnancy.  Nonstress test  (NST). This test checks the health of your baby to make sure there are no signs of problems, such as the baby not getting enough oxygen. During this test, a belt is placed around your belly. The baby is made to move, and its heart rate is monitored during movement. What is false labor? False labor is a condition in which you feel small, irregular tightenings of the muscles in the womb (contractions) that usually go away with rest, changing position, or drinking water. These are called Braxton Hicks contractions. Contractions may last for hours, days, or even weeks before true labor sets in. If contractions come at regular intervals, become more frequent, increase in intensity, or become painful, you should see your health care provider. What are the signs of labor?  Abdominal cramps.  Regular contractions that start at 10 minutes apart and become stronger and more frequent with time.  Contractions that start on the top of the uterus and spread down to the lower abdomen and back.  Increased pelvic pressure and dull back pain.  A watery or bloody mucus discharge that comes from the vagina.  Leaking of amniotic fluid. This is also known as your "water breaking." It could be a slow trickle or a gush. Let your health care provider know if it has a color or strange odor. If you have any of these signs, call your health care provider right away, even if it is before your due date. Follow these instructions at home: Medicines  Follow your health care provider's instructions regarding medicine use. Specific medicines may be either safe or unsafe to take during pregnancy.  Take a prenatal vitamin that contains at least 600 micrograms (mcg) of folic acid.  If you develop constipation, try taking a stool softener if your health care provider approves. Eating and drinking   Eat a balanced diet that includes fresh fruits and vegetables, whole grains, good sources of protein such as meat, eggs, or tofu,  and low-fat dairy. Your health care provider will help you determine the amount of weight gain that is right for you.  Avoid raw meat and uncooked cheese. These carry germs that can cause birth defects in the baby.  If you have low calcium intake from food, talk to your health care provider about whether you should take a daily calcium supplement.  Eat four or five small meals rather than three large meals a day.  Limit foods that are high in fat and processed sugars, such as fried and sweet foods.  To prevent constipation: ? Drink enough fluid to keep your urine clear or pale yellow. ? Eat foods that are high in fiber, such as fresh fruits and vegetables, whole grains, and beans. Activity  Exercise only as directed by your health care provider. Most women can continue their usual exercise routine during pregnancy. Try to exercise for 30 minutes at least 5 days a week. Stop exercising if you experience uterine contractions.  Avoid heavy lifting.  Do   not exercise in extreme heat or humidity, or at high altitudes.  Wear low-heel, comfortable shoes.  Practice good posture.  You may continue to have sex unless your health care provider tells you otherwise. Relieving pain and discomfort  Take frequent breaks and rest with your legs elevated if you have leg cramps or low back pain.  Take warm sitz baths to soothe any pain or discomfort caused by hemorrhoids. Use hemorrhoid cream if your health care provider approves.  Wear a good support bra to prevent discomfort from breast tenderness.  If you develop varicose veins: ? Wear support pantyhose or compression stockings as told by your healthcare provider. ? Elevate your feet for 15 minutes, 3-4 times a day. Prenatal care  Write down your questions. Take them to your prenatal visits.  Keep all your prenatal visits as told by your health care provider. This is important. Safety  Wear your seat belt at all times when driving.  Make  a list of emergency phone numbers, including numbers for family, friends, the hospital, and police and fire departments. General instructions  Avoid cat litter boxes and soil used by cats. These carry germs that can cause birth defects in the baby. If you have a cat, ask someone to clean the litter box for you.  Do not travel far distances unless it is absolutely necessary and only with the approval of your health care provider.  Do not use hot tubs, steam rooms, or saunas.  Do not drink alcohol.  Do not use any products that contain nicotine or tobacco, such as cigarettes and e-cigarettes. If you need help quitting, ask your health care provider.  Do not use any medicinal herbs or unprescribed drugs. These chemicals affect the formation and growth of the baby.  Do not douche or use tampons or scented sanitary pads.  Do not cross your legs for long periods of time.  To prepare for the arrival of your baby: ? Take prenatal classes to understand, practice, and ask questions about labor and delivery. ? Make a trial run to the hospital. ? Visit the hospital and tour the maternity area. ? Arrange for maternity or paternity leave through employers. ? Arrange for family and friends to take care of pets while you are in the hospital. ? Purchase a rear-facing car seat and make sure you know how to install it in your car. ? Pack your hospital bag. ? Prepare the baby's nursery. Make sure to remove all pillows and stuffed animals from the baby's crib to prevent suffocation.  Visit your dentist if you have not gone during your pregnancy. Use a soft toothbrush to brush your teeth and be gentle when you floss. Contact a health care provider if:  You are unsure if you are in labor or if your water has broken.  You become dizzy.  You have mild pelvic cramps, pelvic pressure, or nagging pain in your abdominal area.  You have lower back pain.  You have persistent nausea, vomiting, or diarrhea.   You have an unusual or bad smelling vaginal discharge.  You have pain when you urinate. Get help right away if:  Your water breaks before 37 weeks.  You have regular contractions less than 5 minutes apart before 37 weeks.  You have a fever.  You are leaking fluid from your vagina.  You have spotting or bleeding from your vagina.  You have severe abdominal pain or cramping.  You have rapid weight loss or weight gain.  You have  shortness of breath with chest pain.  You notice sudden or extreme swelling of your face, hands, ankles, feet, or legs.  Your baby makes fewer than 10 movements in 2 hours.  You have severe headaches that do not go away when you take medicine.  You have vision changes. Summary  The third trimester is from week 28 through week 40, months 7 through 9. The third trimester is a time when the unborn baby (fetus) is growing rapidly.  During the third trimester, your discomfort may increase as you and your baby continue to gain weight. You may have abdominal, leg, and back pain, sleeping problems, and an increased need to urinate.  During the third trimester your breasts will keep growing and they will continue to become tender. A yellow fluid (colostrum) may leak from your breasts. This is the first milk you are producing for your baby.  False labor is a condition in which you feel small, irregular tightenings of the muscles in the womb (contractions) that eventually go away. These are called Braxton Hicks contractions. Contractions may last for hours, days, or even weeks before true labor sets in.  Signs of labor can include: abdominal cramps; regular contractions that start at 10 minutes apart and become stronger and more frequent with time; watery or bloody mucus discharge that comes from the vagina; increased pelvic pressure and dull back pain; and leaking of amniotic fluid. This information is not intended to replace advice given to you by your health  care provider. Make sure you discuss any questions you have with your health care provider. Document Released: 07/24/2001 Document Revised: 11/20/2018 Document Reviewed: 09/04/2016 Elsevier Patient Education  2020 [employer]  

## 2019-04-28 NOTE — Progress Notes (Signed)
    Routine Prenatal Care Visit  Subjective  Alexandria Owen is a 28 y.o. G1P0000 at [redacted]w[redacted]d being seen today for ongoing prenatal care.  She is currently monitored for the following issues for this low-risk pregnancy and has Heart palpitations and Supervision of normal first pregnancy, antepartum on their problem list.  ----------------------------------------------------------------------------------- Patient reports no complaints.   Contractions: Not present. Vag. Bleeding: None.  Movement: Present. No leaking of fluid.  ----------------------------------------------------------------------------------- The following portions of the patient's history were reviewed and updated as appropriate: allergies, current medications, past family history, past medical history, past social history, past surgical history and problem list. Problem list updated.   Objective  Blood pressure 102/70, weight 189 lb (85.7 kg), last menstrual period 08/19/2018. Pregravid weight 175 lb (79.4 kg) Total Weight Gain 14 lb (6.35 kg) Urinalysis: Urine dipstick shows negative for glucose, protein.  Fetal Status: Fetal Heart Rate (bpm): 154 Fundal Height: 36 cm Movement: Present     General:  Alert, oriented and cooperative. Patient is in no acute distress.  Skin: Skin is warm and dry. No rash noted.   Cardiovascular: Normal heart rate noted  Respiratory: Normal respiratory effort, no problems with respiration noted  Abdomen: Soft, gravid, appropriate for gestational age. Pain/Pressure: Absent     Pelvic:  Cervical exam performed Dilation: Closed Effacement (%): Thick Station: Ballotable  Extremities: Normal range of motion.  Edema: None  Mental Status: Normal mood and affect. Normal behavior. Normal judgment and thought content.     Assessment   28 y.o. G1P0000 at [redacted]w[redacted]d, EDD 05/26/2019 by Last Menstrual Period presenting for a routine prenatal visit.  Plan   Pregnancy #1 Problems (from 08/19/18 to present)    Problem Noted Resolved   Supervision of normal first pregnancy, antepartum 09/25/2018 by Rod Can, CNM No   Overview Addendum 04/14/2019  9:11 AM by Dalia Heading, [place] Prenatal Labs  Dating  LMP=7wk [place] Blood type: O/Positive/-- (02/27 1003)   Genetic Screen      NIPS:nml XY Antibody:Negative (02/27 1003)  Anatomic [place] [facility] normal Rubella: 1.42 (02/27 1003) Varicella: Immune  GTT   Third trimester:  RPR: Non Reactive (02/27 1003)   Rhogam  not needed HBsAg: Negative (02/27 1003)   TDaP vaccine 03/17/2019  Flu Shot: Oct 2019 HIV: Non Reactive (02/27 1003)   Baby Food   Breast                             GBS:   Contraception Discussed options 9/1 Pap: NIL  CBB  No   CS/VBAC NA   Support Person Alexandria Owen           GBS and Aptima done today.   Preterm labor symptoms and general obstetric precautions including but not limited to vaginal bleeding, contractions, leaking of fluid and fetal movement were reviewed.  Please refer to After Visit Summary for other counseling recommendations.   Return in about 1 week (around 05/05/2019) for ROB.  [name], CNM 04/28/2019  8:27 AM

## 2019-04-30 LAB — CERVICOVAGINAL ANCILLARY ONLY
Chlamydia: NEGATIVE
Neisseria Gonorrhea: NEGATIVE

## 2019-04-30 LAB — STREP GP B NAA: Strep Gp B NAA: NEGATIVE

## 2019-05-05 ENCOUNTER — Other Ambulatory Visit: Payer: Self-pay

## 2019-05-05 ENCOUNTER — Encounter: Payer: Self-pay | Admitting: Obstetrics and Gynecology

## 2019-05-05 ENCOUNTER — Ambulatory Visit (INDEPENDENT_AMBULATORY_CARE_PROVIDER_SITE_OTHER): Payer: Managed Care, Other (non HMO) | Admitting: Obstetrics and Gynecology

## 2019-05-05 VITALS — BP 118/78 | Wt 196.0 lb

## 2019-05-05 DIAGNOSIS — O9989 Other specified diseases and conditions complicating pregnancy, childbirth and the puerperium: Secondary | ICD-10-CM

## 2019-05-05 DIAGNOSIS — Z3400 Encounter for supervision of normal first pregnancy, unspecified trimester: Secondary | ICD-10-CM

## 2019-05-05 DIAGNOSIS — Z3A37 37 weeks gestation of pregnancy: Secondary | ICD-10-CM

## 2019-05-05 DIAGNOSIS — R002 Palpitations: Secondary | ICD-10-CM

## 2019-05-05 NOTE — Progress Notes (Signed)
  Routine Prenatal Care Visit  Subjective  Alexandria Owen is a 28 y.o. G1P0000 at [redacted]w[redacted]d being seen today for ongoing prenatal care.  She is currently monitored for the following issues for this low-risk pregnancy and has Heart palpitations and Supervision of normal first pregnancy, antepartum on their problem list.  ----------------------------------------------------------------------------------- Patient reports no complaints.   Contractions: Not present. Vag. Bleeding: None.  Movement: Present. Leaking Fluid denies.  ----------------------------------------------------------------------------------- The following portions of the patient's history were reviewed and updated as appropriate: allergies, current medications, past family history, past medical history, past social history, past surgical history and problem list. Problem list updated.  Objective  Blood pressure 118/78, weight 196 lb (88.9 kg), last menstrual period 08/19/2018. Pregravid weight 175 lb (79.4 kg) Total Weight Gain 21 lb (9.526 kg) Urinalysis: Urine Protein    Urine Glucose    Fetal Status: Fetal Heart Rate (bpm): 155 Fundal Height: 37 cm Movement: Present  Presentation: Vertex  General:  Alert, oriented and cooperative. Patient is in no acute distress.  Skin: Skin is warm and dry. No rash noted.   Cardiovascular: Normal heart rate noted  Respiratory: Normal respiratory effort, no problems with respiration noted  Abdomen: Soft, gravid, appropriate for gestational age. Pain/Pressure: Absent     Pelvic:  Cervical exam deferred        Extremities: Normal range of motion.  Edema: None  Mental Status: Normal mood and affect. Normal behavior. Normal judgment and thought content.   Assessment   28 y.o. G1P0000 at [redacted]w[redacted]d by  05/26/2019, by Last Menstrual Period presenting for routine prenatal visit  Plan   Pregnancy #1 Problems (from 08/19/18 to present)    Problem Noted Resolved   Supervision of normal first pregnancy,  antepartum 09/25/2018 by Rod Can, CNM No   Overview Addendum 04/14/2019  9:11 AM by Dalia Heading, [place] Prenatal Labs  Dating  LMP=7wk [place] Blood type: O/Positive/-- (02/27 1003)   Genetic Screen      NIPS:nml XY Antibody:Negative (02/27 1003)  Anatomic [place] [facility] normal Rubella: 1.42 (02/27 1003) Varicella: Immune  GTT   Third trimester:  RPR: Non Reactive (02/27 1003)   Rhogam  not needed HBsAg: Negative (02/27 1003)   TDaP vaccine 03/17/2019  Flu Shot: Oct 2019 HIV: Non Reactive (02/27 1003)   Baby Food   Breast                             GBS:   Contraception Discussed options 9/1 Pap: NIL  CBB  No   CS/VBAC NA   Support Person Gwyndolyn Saxon              Term labor symptoms and general obstetric precautions including but not limited to vaginal bleeding, contractions, leaking of fluid and fetal movement were reviewed in detail with the patient. Please refer to After Visit Summary for other counseling recommendations.   Return in about 1 week (around 05/12/2019) for Routine Prenatal Appointment.  Prentice Docker, MD, [facility], [facility] 05/05/2019 9:16 AM

## 2019-05-12 ENCOUNTER — Other Ambulatory Visit: Payer: Self-pay

## 2019-05-12 ENCOUNTER — Ambulatory Visit (INDEPENDENT_AMBULATORY_CARE_PROVIDER_SITE_OTHER): Payer: Managed Care, Other (non HMO) | Admitting: Obstetrics & Gynecology

## 2019-05-12 ENCOUNTER — Encounter: Payer: Self-pay | Admitting: Obstetrics & Gynecology

## 2019-05-12 VITALS — BP 120/80 | Wt 198.0 lb

## 2019-05-12 DIAGNOSIS — Z23 Encounter for immunization: Secondary | ICD-10-CM

## 2019-05-12 DIAGNOSIS — Z3403 Encounter for supervision of normal first pregnancy, third trimester: Secondary | ICD-10-CM

## 2019-05-12 DIAGNOSIS — Z3400 Encounter for supervision of normal first pregnancy, unspecified trimester: Secondary | ICD-10-CM

## 2019-05-12 DIAGNOSIS — Z3A38 38 weeks gestation of pregnancy: Secondary | ICD-10-CM

## 2019-05-12 NOTE — Patient Instructions (Signed)
Braxton Hicks Contractions Contractions of the uterus can occur throughout pregnancy, but they are not always a sign that you are in labor. You may have practice contractions called Braxton Hicks contractions. These false labor contractions are sometimes confused with true labor. What are Braxton Hicks contractions? Braxton Hicks contractions are tightening movements that occur in the muscles of the uterus before labor. Unlike true labor contractions, these contractions do not result in opening (dilation) and thinning of the cervix. Toward the end of pregnancy (32-34 weeks), Braxton Hicks contractions can happen more often and may become stronger. These contractions are sometimes difficult to tell apart from true labor because they can be very uncomfortable. You should not feel embarrassed if you go to the hospital with false labor. Sometimes, the only way to tell if you are in true labor is for your health care provider to look for changes in the cervix. The health care provider will do a physical exam and may monitor your contractions. If you are not in true labor, the exam should show that your cervix is not dilating and your water has not broken. If there are no other health problems associated with your pregnancy, it is completely safe for you to be sent home with false labor. You may continue to have Braxton Hicks contractions until you go into true labor. How to tell the difference between true labor and false labor True labor  Contractions last 30-70 seconds.  Contractions become very regular.  Discomfort is usually felt in the top of the uterus, and it spreads to the lower abdomen and low back.  Contractions do not go away with walking.  Contractions usually become more intense and increase in frequency.  The cervix dilates and gets thinner. False labor  Contractions are usually shorter and not as strong as true labor contractions.  Contractions are usually irregular.  Contractions  are often felt in the front of the lower abdomen and in the groin.  Contractions may go away when you walk around or change positions while lying down.  Contractions get weaker and are shorter-lasting as time goes on.  The cervix usually does not dilate or become thin. Follow these instructions at home:   Take over-the-counter and prescription medicines only as told by your health care provider.  Keep up with your usual exercises and follow other instructions from your health care provider.  Eat and drink lightly if you think you are going into labor.  If Braxton Hicks contractions are making you uncomfortable: ? Change your position from lying down or resting to walking, or change from walking to resting. ? Sit and rest in a tub of warm water. ? Drink enough fluid to keep your urine pale yellow. Dehydration may cause these contractions. ? Do slow and deep breathing several times an hour.  Keep all follow-up prenatal visits as told by your health care provider. This is important. Contact a health care provider if:  You have a fever.  You have continuous pain in your abdomen. Get help right away if:  Your contractions become stronger, more regular, and closer together.  You have fluid leaking or gushing from your vagina.  You pass blood-tinged mucus (bloody show).  You have bleeding from your vagina.  You have low back pain that you never had before.  You feel your baby's head pushing down and causing pelvic pressure.  Your baby is not moving inside you as much as it used to. Summary  Contractions that occur before labor are   called Braxton Hicks contractions, false labor, or practice contractions.  Braxton Hicks contractions are usually shorter, weaker, farther apart, and less regular than true labor contractions. True labor contractions usually become progressively stronger and regular, and they become more frequent.  Manage discomfort from Braxton Hicks contractions  by changing position, resting in a warm bath, drinking plenty of water, or practicing deep breathing. This information is not intended to replace advice given to you by your health care provider. Make sure you discuss any questions you have with your health care provider. Document Released: 12/13/2016 Document Revised: 07/12/2017 Document Reviewed: 12/13/2016 Elsevier Patient Education  2020 Elsevier Inc.  

## 2019-05-12 NOTE — Progress Notes (Signed)
  Subjective  Fetal Movement? yes Contractions? no Leaking Fluid? no Vaginal Bleeding? no  Objective  BP 120/80   Wt 198 lb (89.8 kg)   LMP 08/19/2018 (Exact Date)   BMI 30.11 kg/m  General: NAD Pumonary: no increased work of breathing Abdomen: gravid, non-tender Extremities: no edema Psychiatric: mood appropriate, affect full  Assessment  28 y.o. G1P0000 at [redacted]w[redacted]d by  05/26/2019, by Last Menstrual Period presenting for routine prenatal visit  Plan   Problem List Items Addressed This Visit      Other   Supervision of normal first pregnancy, antepartum    Other Visit Diagnoses    [redacted] weeks gestation of pregnancy    -  Primary   Need for immunization against influenza       Relevant Orders   Flu Vaccine QUAD 36+ mos IM (Completed)      Pregnancy #1 Problems (from 08/19/18 to present)    Problem Noted Resolved   Supervision of normal first pregnancy, antepartum 09/25/2018 by Rod Can, CNM No   Overview Addendum 04/14/2019  9:11 AM by Dalia Heading, [place] Prenatal Labs  Dating  LMP=7wk [place] Blood type: O/Positive/-- (02/27 1003)   Genetic Screen      NIPS:nml XY Antibody:Negative (02/27 1003)  Anatomic [place] [facility] normal Rubella: 1.42 (02/27 1003) Varicella: Immune  GTT   Third trimester:  RPR: Non Reactive (02/27 1003)   Rhogam  not needed HBsAg: Negative (02/27 1003)   TDaP vaccine 03/17/2019  Flu Shot: Oct 2019 HIV: Non Reactive (02/27 1003)   Baby Food   Breast                             GBS:   Contraception Discussed options 9/1 Pap: NIL  CBB  No   CS/VBAC NA   Support Person Gwyndolyn Saxon            Labor precautions discussed  Barnett Applebaum, MD, [facility], [facility] 05/12/2019  11:41 AM

## 2019-05-19 ENCOUNTER — Other Ambulatory Visit: Payer: Self-pay

## 2019-05-19 ENCOUNTER — Ambulatory Visit (INDEPENDENT_AMBULATORY_CARE_PROVIDER_SITE_OTHER): Payer: Managed Care, Other (non HMO) | Admitting: Maternal Newborn

## 2019-05-19 ENCOUNTER — Encounter: Payer: Self-pay | Admitting: Maternal Newborn

## 2019-05-19 VITALS — BP 100/60 | Wt 197.0 lb

## 2019-05-19 DIAGNOSIS — Z3A39 39 weeks gestation of pregnancy: Secondary | ICD-10-CM

## 2019-05-19 DIAGNOSIS — Z3400 Encounter for supervision of normal first pregnancy, unspecified trimester: Secondary | ICD-10-CM

## 2019-05-19 DIAGNOSIS — Z3403 Encounter for supervision of normal first pregnancy, third trimester: Secondary | ICD-10-CM

## 2019-05-19 LAB — POCT URINALYSIS DIPSTICK OB
Glucose, UA: NEGATIVE
POC,PROTEIN,UA: NEGATIVE

## 2019-05-19 NOTE — Addendum Note (Signed)
Addended by: Quintella Baton D on: 05/19/2019 09:22 AM   Modules accepted: Orders

## 2019-05-19 NOTE — Progress Notes (Signed)
    Routine Prenatal Care Visit  Subjective  Alexandria Owen is a 28 y.o. G1P0000 at [redacted]w[redacted]d being seen today for ongoing prenatal care.  She is currently monitored for the following issues for this low-risk pregnancy and has Heart palpitations and Supervision of normal first pregnancy, antepartum on their problem list.  ----------------------------------------------------------------------------------- Patient reports baby has moved down some.    Contractions: Not present. Vag. Bleeding: None.  Movement: Present. No leaking of fluid.  ----------------------------------------------------------------------------------- The following portions of the patient's history were reviewed and updated as appropriate: allergies, current medications, past family history, past medical history, past social history, past surgical history and problem list. Problem list updated.   Objective  Blood pressure 100/60, weight 197 lb (89.4 kg), last menstrual period 08/19/2018. Pregravid weight 175 lb (79.4 kg) Total Weight Gain 22 lb (9.979 kg)  Fetal Status: Fetal Heart Rate (bpm): 130 Fundal Height: 39 cm Movement: Present  Presentation: Vertex  General:  Alert, oriented and cooperative. Patient is in no acute distress.  Skin: Skin is warm and dry. No rash noted.   Cardiovascular: Normal heart rate noted  Respiratory: Normal respiratory effort, no problems with respiration noted  Abdomen: Soft, gravid, appropriate for gestational age. Pain/Pressure: Present     Pelvic:  Cervical exam performed Dilation: Closed Effacement (%): Thick Station: -3  Extremities: Normal range of motion.  Edema: None  Mental Status: Normal mood and affect. Normal behavior. Normal judgment and thought content.     Assessment   27 y.o. G1P0000 at [redacted]w[redacted]d, EDD 05/26/2019 by Last Menstrual Period presenting for a routine prenatal visit.  Plan   Pregnancy #1 Problems (from 08/19/18 to present)    Problem Noted Resolved   Supervision of  normal first pregnancy, antepartum 09/25/2018 by Rod Can, CNM No   Overview Addendum 04/14/2019  9:11 AM by Dalia Heading, [place] Prenatal Labs  Dating  LMP=7wk [place] Blood type: O/Positive/-- (02/27 1003)   Genetic Screen      NIPS:nml XY Antibody:Negative (02/27 1003)  Anatomic [place] [facility] normal Rubella: 1.42 (02/27 1003) Varicella: Immune  GTT   Third trimester:  RPR: Non Reactive (02/27 1003)   Rhogam  not needed HBsAg: Negative (02/27 1003)   TDaP vaccine 03/17/2019  Flu Shot: Oct 2019 HIV: Non Reactive (02/27 1003)   Baby Food   Breast                             GBS:   Contraception Discussed options 9/1 Pap: NIL  CBB  No   CS/VBAC NA   Support Person Gwyndolyn Saxon              Term labor symptoms and general obstetric precautions including but not limited to vaginal bleeding, contractions, leaking of fluid and fetal movement were reviewed.  Please refer to After Visit Summary for other counseling recommendations.   Return in about 1 week (around 05/26/2019) for ROB.  [name], CNM 05/19/2019  9:10 AM

## 2019-05-26 ENCOUNTER — Other Ambulatory Visit: Payer: Self-pay

## 2019-05-26 ENCOUNTER — Encounter: Payer: Self-pay | Admitting: Advanced Practice Midwife

## 2019-05-26 ENCOUNTER — Ambulatory Visit (INDEPENDENT_AMBULATORY_CARE_PROVIDER_SITE_OTHER): Payer: Managed Care, Other (non HMO) | Admitting: Advanced Practice Midwife

## 2019-05-26 VITALS — BP 104/70 | Wt 195.0 lb

## 2019-05-26 DIAGNOSIS — O480 Post-term pregnancy: Secondary | ICD-10-CM

## 2019-05-26 DIAGNOSIS — Z3A40 40 weeks gestation of pregnancy: Secondary | ICD-10-CM

## 2019-05-26 NOTE — Patient Instructions (Signed)

## 2019-05-26 NOTE — Progress Notes (Signed)
No vb. No lof.  

## 2019-05-26 NOTE — Progress Notes (Signed)
  Routine Prenatal Care Visit  Subjective  Alexandria Owen is a 28 y.o. G1P0000 at [redacted]w[redacted]d being seen today for ongoing prenatal care.  She is currently monitored for the following issues for this low-risk pregnancy and has Heart palpitations and Supervision of normal first pregnancy, antepartum on their problem list.  ----------------------------------------------------------------------------------- Patient reports no complaints.  We reviewed induction of labor and hospital admission process. Contractions: Not present. Vag. Bleeding: None.  Movement: Present. Leaking Fluid denies.  ----------------------------------------------------------------------------------- The following portions of the patient's history were reviewed and updated as appropriate: allergies, current medications, past family history, past medical history, past social history, past surgical history and problem list. Problem list updated.  Objective  Blood pressure 104/70, weight 195 lb (88.5 kg), last menstrual period 08/19/2018. Pregravid weight 175 lb (79.4 kg) Total Weight Gain 20 lb (9.072 kg) Urinalysis: Urine Protein    Urine Glucose    Fetal Status: Fetal Heart Rate (bpm): 133 Fundal Height: 40 cm Movement: Present  Presentation: Vertex  General:  Alert, oriented and cooperative. Patient is in no acute distress.  Skin: Skin is warm and dry. No rash noted.   Cardiovascular: Normal heart rate noted  Respiratory: Normal respiratory effort, no problems with respiration noted  Abdomen: Soft, gravid, appropriate for gestational age. Pain/Pressure: Present     Pelvic:  Cervical exam performed Dilation: Closed Effacement (%): 30 Station: -3, posterior  Extremities: Normal range of motion.  Edema: None  Mental Status: Normal mood and affect. Normal behavior. Normal judgment and thought content.   Assessment   28 y.o. G1P0000 at [redacted]w[redacted]d by  05/26/2019, by Last Menstrual Period presenting for routine prenatal visit  Plan    Pregnancy #1 Problems (from 08/19/18 to present)    Problem Noted Resolved   Supervision of normal first pregnancy, antepartum 09/25/2018 by Rod Can, CNM No   Overview Addendum 04/14/2019  9:11 AM by Dalia Heading, [place] Prenatal Labs  Dating  LMP=7wk [place] Blood type: O/Positive/-- (02/27 1003)   Genetic Screen      NIPS:nml XY Antibody:Negative (02/27 1003)  Anatomic [place] [facility] normal Rubella: 1.42 (02/27 1003) Varicella: Immune  GTT   Third trimester:  RPR: Non Reactive (02/27 1003)   Rhogam  not needed HBsAg: Negative (02/27 1003)   TDaP vaccine 03/17/2019  Flu Shot: Oct 2019 HIV: Non Reactive (02/27 1003)   Baby Food   Breast                             GBS:   Contraception Discussed options 9/1 Pap: NIL  CBB  No   CS/VBAC NA   Support Person Gwyndolyn Saxon              Term labor symptoms and general obstetric precautions including but not limited to vaginal bleeding, contractions, leaking of fluid and fetal movement were reviewed in detail with the patient. Please refer to After Visit Summary for other counseling recommendations.   Return for IOL to be scheduled.  Rod Can, CNM 05/26/2019 9:43 AM

## 2019-06-02 ENCOUNTER — Encounter
Admission: RE | Admit: 2019-06-02 | Discharge: 2019-06-02 | Disposition: A | Discharge status: Home / Self Care | Payer: Managed Care, Other (non HMO) | Source: Ambulatory Visit | Attending: Obstetrics & Gynecology | Admitting: Obstetrics & Gynecology

## 2019-06-02 ENCOUNTER — Other Ambulatory Visit: Payer: Self-pay

## 2019-06-02 DIAGNOSIS — Z20828 Contact with and (suspected) exposure to other viral communicable diseases: Secondary | ICD-10-CM | POA: Insufficient documentation

## 2019-06-02 DIAGNOSIS — Z01812 Encounter for preprocedural laboratory examination: Secondary | ICD-10-CM | POA: Insufficient documentation

## 2019-06-02 LAB — SARS CORONAVIRUS 2 (TAT 6-24 HRS): SARS Coronavirus 2: NEGATIVE

## 2019-06-04 ENCOUNTER — Other Ambulatory Visit: Payer: Self-pay

## 2019-06-04 ENCOUNTER — Inpatient Hospital Stay
Admission: RE | Admit: 2019-06-04 | Discharge: 2019-06-07 | DRG: 806 | Disposition: A | Discharge status: Home / Self Care | Payer: Managed Care, Other (non HMO) | Attending: Obstetrics and Gynecology | Admitting: Obstetrics and Gynecology

## 2019-06-04 DIAGNOSIS — D62 Acute posthemorrhagic anemia: Secondary | ICD-10-CM | POA: Diagnosis not present

## 2019-06-04 DIAGNOSIS — O9081 Anemia of the puerperium: Secondary | ICD-10-CM | POA: Diagnosis not present

## 2019-06-04 DIAGNOSIS — Z20828 Contact with and (suspected) exposure to other viral communicable diseases: Secondary | ICD-10-CM | POA: Diagnosis present

## 2019-06-04 DIAGNOSIS — Z3A41 41 weeks gestation of pregnancy: Secondary | ICD-10-CM

## 2019-06-04 DIAGNOSIS — Z3490 Encounter for supervision of normal pregnancy, unspecified, unspecified trimester: Secondary | ICD-10-CM | POA: Diagnosis present

## 2019-06-04 DIAGNOSIS — Z3400 Encounter for supervision of normal first pregnancy, unspecified trimester: Secondary | ICD-10-CM

## 2019-06-04 DIAGNOSIS — O480 Post-term pregnancy: Principal | ICD-10-CM | POA: Diagnosis present

## 2019-06-04 LAB — CBC
HCT: 36.4 % (ref 36.0–46.0)
Hemoglobin: 12.3 g/dL (ref 12.0–15.0)
MCH: 29.1 pg (ref 26.0–34.0)
MCHC: 33.8 g/dL (ref 30.0–36.0)
MCV: 86.3 fL (ref 80.0–100.0)
Platelets: 192 10*3/uL (ref 150–400)
RBC: 4.22 MIL/uL (ref 3.87–5.11)
RDW: 12.7 % (ref 11.5–15.5)
WBC: 8.1 10*3/uL (ref 4.0–10.5)
nRBC: 0 % (ref 0.0–0.2)

## 2019-06-04 LAB — TYPE AND SCREEN
ABO/RH(D): O POS
Antibody Screen: NEGATIVE

## 2019-06-04 MED ORDER — OXYTOCIN 40 UNITS IN NORMAL SALINE INFUSION - SIMPLE MED
1.0000 m[IU]/min | INTRAVENOUS | Status: DC
  Administered 2019-06-04 (×2): 2 m[IU]/min via INTRAVENOUS
  Filled 2019-06-04: qty 1000

## 2019-06-04 MED ORDER — AMMONIA AROMATIC IN INHA
RESPIRATORY_TRACT | Status: AC
  Filled 2019-06-04: qty 10

## 2019-06-04 MED ORDER — OXYTOCIN BOLUS FROM INFUSION
500.0000 mL | Freq: Once | INTRAVENOUS | Status: AC
  Administered 2019-06-05: 500 mL via INTRAVENOUS

## 2019-06-04 MED ORDER — OXYTOCIN 10 UNIT/ML IJ SOLN
INTRAMUSCULAR | Status: AC
  Filled 2019-06-04: qty 2

## 2019-06-04 MED ORDER — SODIUM CHLORIDE (PF) 0.9 % IJ SOLN
INTRAMUSCULAR | Status: AC
  Filled 2019-06-04: qty 50

## 2019-06-04 MED ORDER — LACTATED RINGERS IV SOLN
INTRAVENOUS | Status: DC
  Administered 2019-06-04: 1000 mL via INTRAVENOUS
  Administered 2019-06-04 – 2019-06-05 (×5): via INTRAVENOUS

## 2019-06-04 MED ORDER — LACTATED RINGERS IV SOLN
500.0000 mL | INTRAVENOUS | Status: DC | PRN
  Administered 2019-06-05: 500 mL via INTRAVENOUS

## 2019-06-04 MED ORDER — LIDOCAINE HCL (PF) 1 % IJ SOLN: 30.0000 mL | INTRAMUSCULAR | Status: DC | PRN

## 2019-06-04 MED ORDER — ACETAMINOPHEN 325 MG PO TABS: 650.0000 mg | ORAL_TABLET | ORAL | Status: DC | PRN

## 2019-06-04 MED ORDER — TERBUTALINE SULFATE 1 MG/ML IJ SOLN: 0.2500 mg | Freq: Once | INTRAMUSCULAR | Status: DC | PRN

## 2019-06-04 MED ORDER — OXYTOCIN 40 UNITS IN NORMAL SALINE INFUSION - SIMPLE MED
2.5000 [IU]/h | INTRAVENOUS | Status: DC
  Filled 2019-06-04: qty 1000

## 2019-06-04 MED ORDER — LIDOCAINE HCL (PF) 1 % IJ SOLN
INTRAMUSCULAR | Status: AC
  Filled 2019-06-04: qty 30

## 2019-06-04 MED ORDER — MISOPROSTOL 100 MCG PO TABS
25.0000 ug | ORAL_TABLET | ORAL | Status: DC | PRN
  Administered 2019-06-04: 25 ug via VAGINAL
  Filled 2019-06-04 (×3): qty 1

## 2019-06-04 MED ORDER — ONDANSETRON HCL 4 MG/2ML IJ SOLN
4.0000 mg | Freq: Four times a day (QID) | INTRAMUSCULAR | Status: DC | PRN
  Administered 2019-06-05: 4 mg via INTRAVENOUS
  Filled 2019-06-04: qty 2

## 2019-06-04 MED ORDER — MISOPROSTOL 200 MCG PO TABS
ORAL_TABLET | ORAL | Status: AC
  Filled 2019-06-04: qty 4

## 2019-06-04 MED ORDER — BUTORPHANOL TARTRATE 1 MG/ML IJ SOLN
1.0000 mg | INTRAMUSCULAR | Status: DC | PRN
  Administered 2019-06-05 (×4): 1 mg via INTRAVENOUS
  Filled 2019-06-04 (×4): qty 1

## 2019-06-04 NOTE — Progress Notes (Signed)
SVE: 3/80/-3  AROM, meconium fluid.  IUPC placed.   Continue with pitocin for IOL.   NST: 135 bpm baseline, moderate variability, 15x15 accelerations, no decelerations. Tocometer : every 1-3 minutes  Adrian Prows MD [place], [facility] 06/04/2019 10:09 PM

## 2019-06-04 NOTE — H&P (Signed)
Obstetrics Admission History & Physical   Postdates induction of labor   HPI:  28 y.o. G1P0000 @ [redacted]w[redacted]d (05/26/2019, by Last Menstrual Period). Admitted on 06/04/2019:   Patient Active Problem List   Diagnosis Date Noted  . Encounter for planned induction of labor 06/04/2019  . Supervision of normal first pregnancy, antepartum 09/25/2018  . Heart palpitations 02/02/2016    Presents for a postdates induction of labor. She is having occasional, non-painful contractions. She has not had any vaginal bleeding or loss of fluid. Baby is moving well. No complaints.  Prenatal care at: at [facility]. Pregnancy complicated by none.  ROS: A review of systems was performed and negative, except as stated in the above HPI. PMHx:  Past Medical History:  Diagnosis Date  . Allergic rhinitis   . Vaccine for human papilloma virus (HPV) types 6, 11, 16, and 18 administered    PSHx: History reviewed. No pertinent surgical history. Medications:  Medications Prior to Admission  Medication Sig Dispense Refill Last Dose  . Prenatal Vit-Fe Fumarate-FA (MULTIVITAMIN-PRENATAL) 27-0.8 MG TABS tablet Take 1 tablet by mouth daily at 12 noon.      Allergies: has No Known Allergies. OBHx:  OB History  Gravida Para Term Preterm AB Living  1 0 0 0 0 0  SAB TAB Ectopic Multiple Live Births  0 0 0 0 0    # Outcome Date GA Lbr Len/2nd Weight Sex Delivery Anes PTL Lv  1 Current            FHx:  Family History  Problem Relation Age of Onset  . Breast cancer Neg Hx   . Ovarian cancer Neg Hx    Soc Hx: Never smoker, Alcohol: none in pregnancy, and Recreational drug use: none  Objective:  There were no vitals filed for this visit. Constitutional: Well nourished, well developed female in no acute distress.  HEENT: normal Skin: Warm and dry.  Cardiovascular: Regular rate and rhythm.   Extremity: trace to 1+ bilateral pedal edema Respiratory: Clear to auscultation bilaterally. Normal respiratory  effort Abdomen: gravis, non-tender Neuro: Cranial nerves grossly intact Psych: Alert and Oriented x3. No memory deficits. Normal mood and affect.  MS: normal gait, normal bilateral lower extremity ROM/strength/stability.  Cervix: 1/50/-3 (RN exam) Uterus: Spontaneous uterine activity   EFM:FHR: 140 bpm, variability: moderate,  accelerations:  Present,  decelerations:  Absent Toco: occasional contractions   Perinatal info:  Blood type: O positive Rubella - Immune Varicella - Immune TDaP Given during third trimester of this pregnancy on 03/17/2019 RPR NR / HIV Neg/ HBsAg Neg   Assessment & Plan:   28 y.o. G1P0000 @ [redacted]w[redacted]d, Admitted on 06/04/2019 for a postdates induction of labor.  Starting induction with Cytotec 25 mcg.    Observe for cervical change, Fetal Wellbeing Reassuring, Epidural when ready, AROM when Appropriate and GBS status negative, treat as needed  [name], [facility] 06/04/2019  10:20 AM

## 2019-06-04 NOTE — Progress Notes (Signed)
SVE 1.5/70/-3  NST: 125 bpm baseline, moderate variability, 15x15 accelerations, no decelerations. Tocometer : every 1-3 minutes  Foley bulb placed easily.  Will start pitocin/  Adrian Prows MD [place], [facility] 06/04/2019 3:18 PM

## 2019-06-05 ENCOUNTER — Inpatient Hospital Stay: Payer: Managed Care, Other (non HMO) | Admitting: Anesthesiology

## 2019-06-05 ENCOUNTER — Encounter: Payer: Self-pay | Admitting: *Deleted

## 2019-06-05 ENCOUNTER — Other Ambulatory Visit: Payer: Self-pay

## 2019-06-05 LAB — RPR: RPR Ser Ql: NONREACTIVE

## 2019-06-05 MED ORDER — IBUPROFEN 600 MG PO TABS
600.0000 mg | ORAL_TABLET | Freq: Four times a day (QID) | ORAL | Status: DC
  Administered 2019-06-05 – 2019-06-07 (×6): 600 mg via ORAL
  Filled 2019-06-05 (×7): qty 1

## 2019-06-05 MED ORDER — EPHEDRINE 5 MG/ML INJ
10.0000 mg | INTRAVENOUS | Status: DC | PRN
  Filled 2019-06-05: qty 2

## 2019-06-05 MED ORDER — LIDOCAINE-EPINEPHRINE (PF) 1.5 %-1:200000 IJ SOLN
INTRAMUSCULAR | Status: DC | PRN
  Administered 2019-06-05: 3 mL

## 2019-06-05 MED ORDER — DIPHENHYDRAMINE HCL 50 MG/ML IJ SOLN: 12.5000 mg | INTRAMUSCULAR | Status: DC | PRN

## 2019-06-05 MED ORDER — BENZOCAINE-MENTHOL 20-0.5 % EX AERO
1.0000 "application " | INHALATION_SPRAY | CUTANEOUS | Status: DC | PRN
  Administered 2019-06-06: 1 via TOPICAL
  Filled 2019-06-05: qty 56

## 2019-06-05 MED ORDER — METHYLERGONOVINE MALEATE 0.2 MG/ML IJ SOLN
INTRAMUSCULAR | Status: AC
  Filled 2019-06-05: qty 1

## 2019-06-05 MED ORDER — FENTANYL 2.5 MCG/ML W/ROPIVACAINE 0.15% IN NS 100 ML EPIDURAL (ARMC)
EPIDURAL | Status: DC | PRN
  Administered 2019-06-05: 12 mL/h via EPIDURAL
  Administered 2019-06-05: 250 ug via EPIDURAL

## 2019-06-05 MED ORDER — FENTANYL 2.5 MCG/ML W/ROPIVACAINE 0.15% IN NS 100 ML EPIDURAL (ARMC)
EPIDURAL | Status: AC
  Administered 2019-06-05: 250 ug
  Filled 2019-06-05: qty 100

## 2019-06-05 MED ORDER — CLINDAMYCIN PHOSPHATE 900 MG/50ML IV SOLN
INTRAVENOUS | Status: AC
  Administered 2019-06-05: 900 mg via INTRAVENOUS
  Filled 2019-06-05: qty 50

## 2019-06-05 MED ORDER — SODIUM CHLORIDE 0.9 % IV SOLN
INTRAVENOUS | Status: DC | PRN
  Administered 2019-06-05 (×3): 5 mL via EPIDURAL

## 2019-06-05 MED ORDER — PHENYLEPHRINE 40 MCG/ML (10ML) SYRINGE FOR IV PUSH (FOR BLOOD PRESSURE SUPPORT)
80.0000 ug | PREFILLED_SYRINGE | INTRAVENOUS | Status: DC | PRN
  Filled 2019-06-05: qty 10

## 2019-06-05 MED ORDER — FENTANYL 2.5 MCG/ML W/ROPIVACAINE 0.15% IN NS 100 ML EPIDURAL (ARMC)
12.0000 mL/h | EPIDURAL | Status: DC
  Filled 2019-06-05: qty 100

## 2019-06-05 MED ORDER — LACTATED RINGERS IV SOLN
500.0000 mL | Freq: Once | INTRAVENOUS | Status: AC
  Administered 2019-06-05: 500 mL via INTRAVENOUS

## 2019-06-05 MED ORDER — CARBOPROST TROMETHAMINE 250 MCG/ML IM SOLN
INTRAMUSCULAR | Status: AC
  Filled 2019-06-05: qty 1

## 2019-06-05 MED ORDER — CLINDAMYCIN PHOSPHATE 900 MG/50ML IV SOLN
900.0000 mg | Freq: Once | INTRAVENOUS | Status: AC
  Administered 2019-06-05: 20:00:00 900 mg via INTRAVENOUS

## 2019-06-05 MED ORDER — GENTAMICIN SULFATE 40 MG/ML IJ SOLN
1.5000 mg/kg | Freq: Once | INTRAVENOUS | Status: AC
  Administered 2019-06-05: 130 mg via INTRAVENOUS
  Filled 2019-06-05: qty 3.25

## 2019-06-05 MED ORDER — FENTANYL-BUPIVACAINE-NACL 0.5-0.125-0.9 MG/250ML-% EP SOLN: 12.0000 mL/h | EPIDURAL | Status: DC | PRN

## 2019-06-05 NOTE — Anesthesia Preprocedure Evaluation (Signed)
Anesthesia Evaluation  Patient identified by MRN, date of birth, ID band Patient awake    Reviewed: Allergy & Precautions, NPO status , Patient's Chart, lab work & pertinent test results  History of Anesthesia Complications Negative for: history of anesthetic complications  Airway Mallampati: II  TM Distance: >3 FB Neck ROM: Full    Dental no notable dental hx.    Pulmonary neg pulmonary ROS, neg sleep apnea, neg COPD,    breath sounds clear to auscultation- rhonchi (-) wheezing      Cardiovascular Exercise Tolerance: Good (-) hypertension(-) CAD and (-) Past MI  Rhythm:Regular Rate:Normal - Systolic murmurs and - Diastolic murmurs    Neuro/Psych negative neurological ROS  negative psych ROS   GI/Hepatic negative GI ROS, Neg liver ROS,   Endo/Other  negative endocrine ROSneg diabetes  Renal/GU negative Renal ROS     Musculoskeletal negative musculoskeletal ROS (+)   Abdominal (+) - obese, Gravid abdomen  Peds  Hematology negative hematology ROS (+)   Anesthesia Other Findings Past Medical History: No date: Allergic rhinitis No date: Vaccine for human papilloma virus (HPV) types 6, 11, 16, and  18 administered   Reproductive/Obstetrics (+) Pregnancy                             Lab Results  Component Value Date   WBC 8.1 06/04/2019   HGB 12.3 06/04/2019   HCT 36.4 06/04/2019   MCV 86.3 06/04/2019   PLT 192 06/04/2019    Anesthesia Physical Anesthesia Plan  ASA: II  Anesthesia Plan: Epidural   Post-op Pain Management:    Induction:   PONV Risk Score and Plan: 2 and Treatment may vary due to age or medical condition  Airway Management Planned:   Additional Equipment:   Intra-op Plan:   Post-operative Plan:   Informed Consent: I have reviewed the patients History and Physical, chart, labs and discussed the procedure including the risks, benefits and alternatives for  the proposed anesthesia with the patient or authorized representative who has indicated his/her understanding and acceptance.       Plan Discussed with: CRNA and Anesthesiologist  Anesthesia Plan Comments: (Plan for epidural for labor, discussed epidural vs spinal vs GA if need for csection)        Anesthesia Quick Evaluation

## 2019-06-05 NOTE — Progress Notes (Signed)
Called to see patient for retained placenta approximately 45 minutes since vaginal delivery.  Manual extractions revealed a good plane between placenta and endometrium.  The placenta was slightly more adherent at the fundus but a fracture plane was appreciated.  The placenta was delivered intact.  Bedside ultrasound confirmed no evidence of retained placenta and a thin endometrial stripe.  Appropriate/Minimal lochia noted.

## 2019-06-05 NOTE — Discharge Summary (Signed)
  OB Discharge Summary     Patient Name: Alexandria Owen DOB: 27-Jan-1991 MRN: [id number]  Date of admission: 06/04/2019 Delivering provider: Rod Can, CNM  Date of Delivery: 06/05/2019  Date of discharge: 06/07/2019  Admitting diagnosis: L and D, postdates induction of labor Intrauterine pregnancy: [redacted]w[redacted]d     Secondary diagnosis: None     Discharge diagnosis: Term Pregnancy Delivered                                                                                                Post partum procedures: Manual extraction of placenta  Augmentation: AROM, Pitocin and Cytotec  Complications: [id number]  Hospital course:  Induction of Labor With Vaginal Delivery   28 y.o. yo G1P0000 at [redacted]w[redacted]d was admitted to the hospital 06/04/2019 for induction of labor.  Indication for induction: Postdates.  Patient had a labor course as follows: Membrane Rupture Time/Date: 9:40 PM ,06/04/2019   Patient had delivery of viable female 7:26 PM, 06/05/2019 Details of delivery can be found in separate delivery note.    Patient had a routine postpartum course.  Patient is discharged home 06/07/19.  Physical exam  Vitals:   06/06/19 1152 06/06/19 1612 06/06/19 2345 06/07/19 0829  BP: 109/70 104/66 119/65 112/73  Pulse: 84 79 97 69  Resp: 20 18 18 20   Temp: 97.9 F (36.6 C) 98.1 F (36.7 C) 97.8 F (36.6 C) 98 F (36.7 C)  TempSrc: Oral Oral Oral Oral  SpO2:   99% 99%  Weight:      Height:       General: alert, cooperative and no distress Lochia: appropriate Uterine Fundus: firm Incision: N/A DVT Evaluation: No evidence of DVT   Labs: Lab Results  Component Value Date   WBC 25.0 (H) 06/06/2019   HGB 9.4 (L) 06/06/2019   HCT 27.8 (L) 06/06/2019   MCV 86.9 06/06/2019   PLT 164 06/06/2019    Discharge instruction: per After Visit Summary.  Medications:  Allergies as of 06/07/2019   No Known Allergies     Medication List    TAKE these medications   multivitamin-prenatal  27-0.8 MG Tabs tablet Take 1 tablet by mouth daily at 12 noon.       Diet: routine diet  Activity: Advance as tolerated. Pelvic rest for 6 weeks.   Outpatient follow up: Follow-up Information    Rod Can, CNM. Schedule an appointment as soon as possible for a visit in 6 week(s).   Specialty: Obstetrics Why: postpartum follow up visit Contact information: [address] [place] [address] [phone]             Postpartum contraception: Undecided, possibly Nexplanon Rhogam Given postpartum: NA Rubella vaccine given postpartum: Immune Varicella vaccine given postpartum: Immune TDaP given antepartum or postpartum: given antepartum  Newborn Data: Live born female [name] Birth Weight: 8 lb 1.8 oz (3680 g) APGAR: 8, 9  Newborn Delivery   Birth date/time: 06/05/2019 19:26:00 Delivery type: Vaginal, Spontaneous       Baby Feeding: Breast  Disposition: home with mother  SIGNED:  Rexene Agent, CNM 06/07/2019 9:02 AM

## 2019-06-05 NOTE — Progress Notes (Signed)
  Labor Progress Note   28 y.o. G1P0000 @ [redacted]w[redacted]d , admitted for  Pregnancy, Labor Management.   Subjective:  Comfortable with epidural. She feels some rectal pressure.  Objective:  BP 114/69   Pulse 82   Temp 97.9 F (36.6 C) (Oral)   Resp 16   Ht 5\' 8"  (1.727 m)   Wt 88.9 kg   LMP 08/19/2018 (Exact Date)   SpO2 95%   BMI 29.80 kg/m  Abd: gravid, ND, FHT present, mild tenderness on exam Extr: no edema SVE: CERVIX: 10 cm dilated, 100 effaced, +1 station  EFM: FHR: 135 bpm, variability: moderate,  accelerations:  Present,  decelerations:  Present a few early decelerations Toco: Frequency: Every 2 minutes Labs: I have reviewed the patient's lab results.   Assessment & Plan:  G1P0000 @ [redacted]w[redacted]d, admitted for  Pregnancy and Labor/Delivery Management  1. Pain management: epidural. 2. FWB: FHT category I.  3. ID: GBS negative 4. Labor management: labor down  All discussed with patient, see orders   Rod Can, [facility] 06/05/2019  5:23 PM

## 2019-06-05 NOTE — Anesthesia Procedure Notes (Signed)
Epidural Patient location during procedure: OB Start time: 06/05/2019 9:35 AM End time: 06/05/2019 9:53 AM  Staffing Anesthesiologist: Emmie Niemann, MD Performed: anesthesiologist   Preanesthetic Checklist Completed: patient identified, site marked, surgical consent, pre-op evaluation, timeout performed, IV checked, risks and benefits discussed and monitors and equipment checked  Epidural Patient position: sitting Prep: ChloraPrep Patient monitoring: heart rate, continuous pulse ox and blood pressure Approach: midline Location: L3-L4 Injection technique: LOR saline  Needle:  Needle type: Tuohy  Needle gauge: 18 G Needle length: 9 cm and 9 Needle insertion depth: 6 cm Catheter type: closed end flexible Catheter size: 20 Guage Catheter at skin depth: 10 cm Test dose: negative (0.125% bupivacaine)  Assessment Events: blood not aspirated, injection not painful, no injection resistance, negative IV test and no paresthesia  Additional Notes   Patient tolerated the insertion well without complications.Reason for block:procedure for pain

## 2019-06-06 LAB — CBC
HCT: 27.8 % — ABNORMAL LOW (ref 36.0–46.0)
Hemoglobin: 9.4 g/dL — ABNORMAL LOW (ref 12.0–15.0)
MCH: 29.4 pg (ref 26.0–34.0)
MCHC: 33.8 g/dL (ref 30.0–36.0)
MCV: 86.9 fL (ref 80.0–100.0)
Platelets: 164 10*3/uL (ref 150–400)
RBC: 3.2 MIL/uL — ABNORMAL LOW (ref 3.87–5.11)
RDW: 12.8 % (ref 11.5–15.5)
WBC: 25 10*3/uL — ABNORMAL HIGH (ref 4.0–10.5)
nRBC: 0 % (ref 0.0–0.2)

## 2019-06-06 MED ORDER — ACETAMINOPHEN 325 MG PO TABS: 650.0000 mg | ORAL_TABLET | ORAL | Status: DC | PRN

## 2019-06-06 MED ORDER — ONDANSETRON HCL 4 MG PO TABS: 4.0000 mg | ORAL_TABLET | ORAL | Status: DC | PRN

## 2019-06-06 MED ORDER — SIMETHICONE 80 MG PO CHEW: 80.0000 mg | CHEWABLE_TABLET | ORAL | Status: DC | PRN

## 2019-06-06 MED ORDER — FERROUS SULFATE 325 (65 FE) MG PO TABS
325.0000 mg | ORAL_TABLET | Freq: Two times a day (BID) | ORAL | Status: DC
  Administered 2019-06-06 – 2019-06-07 (×2): 325 mg via ORAL
  Filled 2019-06-06 (×2): qty 1

## 2019-06-06 MED ORDER — SENNOSIDES-DOCUSATE SODIUM 8.6-50 MG PO TABS
2.0000 | ORAL_TABLET | ORAL | Status: DC
  Administered 2019-06-06 – 2019-06-07 (×2): 2 via ORAL
  Filled 2019-06-06 (×2): qty 2

## 2019-06-06 MED ORDER — COCONUT OIL OIL
1.0000 "application " | TOPICAL_OIL | Status: DC | PRN
  Filled 2019-06-06: qty 120

## 2019-06-06 MED ORDER — DIPHENHYDRAMINE HCL 25 MG PO CAPS: 25.0000 mg | ORAL_CAPSULE | Freq: Four times a day (QID) | ORAL | Status: DC | PRN

## 2019-06-06 MED ORDER — WITCH HAZEL-GLYCERIN EX PADS: 1.0000 "application " | MEDICATED_PAD | CUTANEOUS | Status: DC | PRN

## 2019-06-06 MED ORDER — ONDANSETRON HCL 4 MG/2ML IJ SOLN: 4.0000 mg | INTRAMUSCULAR | Status: DC | PRN

## 2019-06-06 MED ORDER — PRENATAL MULTIVITAMIN CH
1.0000 | ORAL_TABLET | Freq: Every day | ORAL | Status: DC
  Administered 2019-06-06: 1 via ORAL
  Filled 2019-06-06: qty 1

## 2019-06-06 MED ORDER — DIBUCAINE (PERIANAL) 1 % EX OINT: 1.0000 "application " | TOPICAL_OINTMENT | CUTANEOUS | Status: DC | PRN

## 2019-06-06 NOTE — Progress Notes (Signed)
PPD#1 SVD with manual extraction of placenta Subjective:  Sitting up in bed, well-appearing.  No pain currently. Voiding without difficulty. Tolerating a regular diet. Ambulating well with no dizziness.  Objective:  Blood pressure 102/69, pulse 87, temperature 98 F (36.7 C), temperature source Oral, resp. rate 20, height 5\' 8"  (1.727 m), weight 88.9 kg, last menstrual period 08/19/2018, SpO2 97 %, currently breastfeeding.  General: NAD Pulmonary: no increased work of breathing Abdomen: non-distended, non-tender Uterus:  fundus firm; lochia rubra light Extremities: trace edema, no erythema, no tenderness  Results for orders placed or performed during the hospital encounter of 06/04/19 (from the past 72 hour(s))  Type and screen     Status: None   Collection Time: 06/04/19  8:17 AM  Result Value Ref Range   ABO/RH(D) O POS    Antibody Screen NEG    Sample Expiration      06/07/2019,2359 Performed at [facility], [address] [place], [place] [address]   CBC     Status: None   Collection Time: 06/04/19  9:00 AM  Result Value Ref Range   WBC 8.1 4.0 - 10.5 K/uL   RBC 4.22 3.87 - 5.11 MIL/uL   Hemoglobin 12.3 12.0 - 15.0 g/dL   HCT 36.4 36.0 - 46.0 %   MCV 86.3 80.0 - 100.0 fL   MCH 29.1 26.0 - 34.0 pg   MCHC 33.8 30.0 - 36.0 g/dL   RDW 12.7 11.5 - 15.5 %   Platelets 192 150 - 400 K/uL   nRBC 0.0 0.0 - 0.2 %    Comment: Performed at [facility], [address] [place], [place] [address]  RPR     Status: None   Collection Time: 06/04/19  9:00 AM  Result Value Ref Range   RPR Ser Ql NON REACTIVE NON REACTIVE    Comment: Performed at [facility] [address] [place], [place] [address]  CBC     Status: Abnormal   Collection Time: 06/06/19  6:29 AM  Result Value Ref Range   WBC 25.0 (H) 4.0 - 10.5 K/uL   RBC 3.20 (L) 3.87 - 5.11 MIL/uL   Hemoglobin 9.4 (L) 12.0 - 15.0 g/dL   HCT 27.8 (L) 36.0 - 46.0 %   MCV 86.9 80.0 - 100.0 fL   MCH 29.4  26.0 - 34.0 pg   MCHC 33.8 30.0 - 36.0 g/dL   RDW 12.8 11.5 - 15.5 %   Platelets 164 150 - 400 K/uL   nRBC 0.0 0.0 - 0.2 %    Comment: Performed at [facility], [address], [place], [place] [address]    Assessment:   28 y.o. G1P1001 postpartum day #1 recovering well  Plan:  1) Acute blood loss anemia - hemodynamically stable and asymptomatic - PO ferrous sulfate  2) Blood Type --/--/O POS (10/22 0817)   3) Rubella 1.42 (02/27 1003) / Varicella Immune / TDAP status: received antepartum 03/17/2019  4) Breastfeeding; to see lactation today  5) Contraception: undecided  6) Disposition: continue postpartum care.  [name], CNM 06/06/2019  9:33 AM

## 2019-06-06 NOTE — Anesthesia Postprocedure Evaluation (Signed)
Anesthesia Post Note  Patient: Alexandria Owen  Procedure(s) Performed: AN AD HOC LABOR EPIDURAL  Patient location during evaluation: Mother Baby Anesthesia Type: Epidural Level of consciousness: awake and alert Pain management: pain level controlled Vital Signs Assessment: post-procedure vital signs reviewed and stable Respiratory status: spontaneous breathing, nonlabored ventilation and respiratory function stable Cardiovascular status: stable Postop Assessment: no headache, no backache and epidural receding Anesthetic complications: no     Last Vitals:  Vitals:   06/06/19 0834 06/06/19 1152  BP: 102/69 109/70  Pulse: 87 84  Resp: 20 20  Temp: 36.7 C 36.6 C  SpO2: 97%     Last Pain:  Vitals:   06/06/19 1152  TempSrc: Oral  PainSc:                  Arnold Depinto S

## 2019-06-06 NOTE — Lactation Note (Signed)
This note was copied from a baby's chart. Lactation Consultation Note  Patient Name: Alexandria Owen [id number] Date: 06/06/2019 Reason for consult: Follow-up assessment;Mother's request;Difficult latch;Primapara;Term;Other (Comment)(Flat nipple that slightly invert with compression - NS need)  Assisted mom with several breast feeds.  Mom has flat nipples that slightly invert with compression.  [name] can latch to left breast without nipple shield, but has difficulty sustaining latch.  Once #20 nipple shield is applied, he can sustain latch for longer periods.  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  Finally got him to latch well with nipple shield after circumcision and he nursed well with strong rhythimc sucking.  Encouraged mom to put [name] to the breast whenever he demonstrated feeding cues. Information given and discussed process of getting DEBP through [employer].  Reviewed normal newborn stomach size, supply and demand, normal course of lactation and routine newborn feeding patterns. Maternal Data Formula Feeding for Exclusion: No Has patient been taught Hand Expression?: Yes Does the patient have breastfeeding experience prior to this delivery?: No(Gr1)  Feeding Feeding Type: Breast Fed  LATCH Score Latch: Repeated attempts needed to sustain latch, nipple held in mouth throughout feeding, stimulation needed to elicit sucking reflex.  Audible Swallowing: A few with stimulation  Type of Nipple: Flat(Slight inversion when compressed - NS needed to sustain latc)  Comfort (Breast/Nipple): Filling, red/small blisters or bruises, mild/mod discomfort  Hold (Positioning): Assistance needed to correctly position infant at breast and maintain latch.  LATCH Score: 5  Interventions Interventions: Breast feeding basics reviewed;Assisted with latch;Skin to skin;Breast massage;Hand express;Pre-pump if needed;Reverse pressure;Breast compression;Adjust  position;Support pillows;Position options;Coconut oil  Lactation Tools Discussed/Used Tools: Coconut oil;Nipple [place] [employer];Other (comment) Nipple shield size: 20 WIC Program: [employer])   Consult Status Consult Status: Follow-up Follow-up type: Call as needed    Jarold Motto 06/06/2019, 8:21 PM

## 2019-06-07 NOTE — Progress Notes (Signed)
Reviewed D/C instructions with pt and family. Pt verbalized understanding of teaching. Discharged to home via W/C. Pt to schedule f/u appt.  

## 2019-06-07 NOTE — Lactation Note (Signed)
This note was copied from a baby's chart. Lactation Consultation Note  Patient Name: Alexandria Owen [id number] Date: 06/07/2019 Reason for consult: Follow-up assessment;Mother's request;Primapara;Term  Observed mom breast feed [name] without nipple shield.  Mom reports slightly tender nipples.  Coconut oil given and instructed in use.  Mom denies need for comfort gels.  Mom and baby to be discharged today.  Parents had questions about pumping.  Mom has [employer] but has not gotten pump yet because had put on their registry but did not get.  Information given and explained process of getting DEBP through [place].  Reviewed community resources available with contact numbers and encouraged to call with any questions, concerns or assistance. Maternal Data Formula Feeding for Exclusion: No Has patient been taught Hand Expression?: Yes Does the patient have breastfeeding experience prior to this delivery?: No(Gr1)  Feeding Feeding Type: Breast Fed  LATCH Score Latch: Repeated attempts needed to sustain latch, nipple held in mouth throughout feeding, stimulation needed to elicit sucking reflex.  Audible Swallowing: A few with stimulation  Type of Nipple: Flat  Comfort (Breast/Nipple): Filling, red/small blisters or bruises, mild/mod discomfort  Hold (Positioning): No assistance needed to correctly position infant at breast.  LATCH Score: 6  Interventions Interventions: Breast massage;Reverse pressure;Breast compression;Support pillows;Coconut oil(Declined comfort gels)  Lactation Tools Discussed/Used Tools: (Did not use nipple shield with this breast feed) WIC Program: [employer])   Consult Status Consult Status: PRN Follow-up type: Call as needed    Jarold Motto 06/07/2019, 1:21 PM

## 2019-07-13 ENCOUNTER — Telehealth: Payer: Self-pay | Admitting: Advanced Practice Midwife

## 2019-07-13 NOTE — Telephone Encounter (Signed)
Paragard reserved for this patient. 

## 2019-07-13 NOTE — Telephone Encounter (Signed)
paraguard for 12/1 at 930 with JEG

## 2019-07-14 ENCOUNTER — Other Ambulatory Visit: Payer: Self-pay

## 2019-07-14 ENCOUNTER — Encounter: Payer: Self-pay | Admitting: Advanced Practice Midwife

## 2019-07-14 ENCOUNTER — Ambulatory Visit (INDEPENDENT_AMBULATORY_CARE_PROVIDER_SITE_OTHER): Payer: Managed Care, Other (non HMO) | Admitting: Advanced Practice Midwife

## 2019-07-14 DIAGNOSIS — Z30430 Encounter for insertion of intrauterine contraceptive device: Secondary | ICD-10-CM | POA: Diagnosis not present

## 2019-07-14 DIAGNOSIS — Z1389 Encounter for screening for other disorder: Secondary | ICD-10-CM | POA: Diagnosis not present

## 2019-07-14 NOTE — Progress Notes (Addendum)
Postpartum Visit  Date of Service: 07/14/2019  Chief Complaint:  Chief Complaint  Patient presents with  . Postpartum Care    vaginal delivery 10/23/ lightheaded/blurred vision since delivery  . Contraception    Paragard insertion    History of Present Illness: Patient is a 28 y.o. G1P1001 presents for postpartum visit.  Review the Delivery Report for details.  Date of delivery: 06/05/2019 Type of delivery: Vaginal delivery - Vacuum or forceps assisted  no Episiotomy No.  Laceration: left vaginal wall abrasion  Pregnancy or labor problems:  Manual placental extraction Any problems since the delivery:  A few episodes of postural lightheaded- she admits adequate hydration. We discussed the importance of adequate hydration and protein/calorie intake while breastfeeding.  She reports breastfeeding is going well.   Newborn Details:  SINGLETON :  85. BabyGender female. Birth weight: 8 pounds 2 ounces Maternal Details:  Breast or formula feeding: breastfeeding Intercourse: No  Contraception after delivery: Paragard today Any bowel or bladder issues: No  Post partum depression/anxiety noted:  no Edinburgh Post-Partum Depression Score: 4 Date of last PAP: 2019  no abnormalities   Review of Systems: Review of Systems  Constitutional: Negative.   HENT: Negative.   Eyes: Negative.   Respiratory: Negative.   Cardiovascular: Negative.   Gastrointestinal: Negative.   Genitourinary: Negative.   Musculoskeletal: Negative.   Skin: Negative.   Neurological: Negative.   Endo/Heme/Allergies: Negative.   Psychiatric/Behavioral: Negative.      Past Medical History:  Past Medical History:  Diagnosis Date  . Allergic rhinitis   . Vaccine for human papilloma virus (HPV) types 6, 11, 16, and 18 administered     Past Surgical History:  History reviewed. No pertinent surgical history.  Family History:  Family History  Problem Relation Age of Onset  . Breast cancer Neg Hx   .  Ovarian cancer Neg Hx     Social History:  Social History   Socioeconomic History  . Marital status: Married    Spouse name: Not on file  . Number of children: Not on file  . Years of education: Not on file  . Highest education level: Not on file  Occupational History  . Not on file  Social Needs  . Financial resource strain: Not on file  . Food insecurity    Worry: Not on file    Inability: Not on file  . Transportation needs    Medical: Not on file    Non-medical: Not on file  Tobacco Use  . Smoking status: Never Smoker  . Smokeless tobacco: Never Used  Substance and Sexual Activity  . Alcohol use: Yes    Comment: occasional  . Drug use: No  . Sexual activity: Yes    Birth control/protection: None, I.U.D.    Comment: Paragard  Lifestyle  . Physical activity    Days per week: Not on file    Minutes per session: Not on file  . Stress: Not on file  Relationships  . Social Herbalist on phone: Not on file    Gets together: Not on file    Attends religious service: Not on file    Active member of club or organization: Not on file    Attends meetings of clubs or organizations: Not on file    Relationship status: Not on file  . Intimate partner violence    Fear of current or ex partner: Not on file    Emotionally abused: Not on file  Physically abused: Not on file    Forced sexual activity: Not on file  Other Topics Concern  . Not on file  Social History Narrative  . Not on file    Allergies:  No Known Allergies  Medications: Prior to Admission medications   Medication Sig Start Date End Date Taking? Authorizing Provider  Prenatal Vit-Fe Fumarate-FA (MULTIVITAMIN-PRENATAL) 27-0.8 MG TABS tablet Take 1 tablet by mouth daily at 12 noon.   Yes [provider]    Physical Exam Blood pressure 104/72, pulse 84, height 5\' 8"  (1.727 m), weight 167 lb (75.8 kg), last menstrual period 08/19/2018, currently breastfeeding.    General: NAD  HEENT: normocephalic, anicteric Pulmonary: No increased work of breathing Abdomen: NABS, soft, non-tender, non-distended.  Umbilicus without lesions.  No hepatomegaly, splenomegaly or masses palpable. No evidence of hernia. Genitourinary:  External: Normal external female genitalia.  Normal urethral meatus, normal  Bartholin's and Skene's glands.    Vagina: Normal vaginal mucosa, no evidence of prolapse.    Cervix: Grossly normal in appearance, no bleeding, no CMT  Uterus: Non-enlarged, mobile, normal contour.    Adnexa: ovaries non-enlarged, no adnexal masses  Rectal: deferred Extremities: no edema, erythema, or tenderness Neurologic: Grossly intact Psychiatric: mood appropriate, affect full  Edinburgh Postnatal Depression Scale - 07/14/19 0946      Edinburgh Postnatal Depression Scale:  In the Past 7 Days   I have been able to laugh and see the funny side of things.  0    I have looked forward with enjoyment to things.  0    I have blamed myself unnecessarily when things went wrong.  0    I have been anxious or worried for no good reason.  1    I have felt scared or panicky for no good reason.  1    Things have been getting on top of me.  1    I have been so unhappy that I have had difficulty sleeping.  0    I have felt sad or miserable.  0    I have been so unhappy that I have been crying.  1    The thought of harming myself has occurred to me.  0    Edinburgh Postnatal Depression Scale Total  4       Assessment: 28 y.o. G1P1001 presenting for 6 week postpartum visit  Plan: Problem List Items Addressed This Visit    None    Visit Diagnoses    6 weeks postpartum follow-up    -  Primary   Encounter for insertion of ParaGard IUD           1) Contraception - Education given regarding options for contraception, as well as compatibility with breast feeding if applicable.  Patient plans on IUD for contraception.  2)  Pap - ASCCP guidelines and rational discussed.  ASCCP  guidelines and rational discussed.  Patient opts for every 3 years screening interval  3) Patient underwent screening for postpartum depression with no signs of depression  4) Return in about 4 weeks (around 08/11/2019) for iud string check.   Rod Can, [facility] 07/14/2019, 10:24 AM     GYNECOLOGY OFFICE PROCEDURE NOTE  Alexandria Owen is a 28 y.o. G1P1001 here for Paragard IUD insertion. No GYN concerns.  Last pap smear was 1 year ago and was normal.  The patient is currently using postpartum abstinence for contraception and her LMP is Patient's last menstrual period was 08/19/2018 (  exact date)..  The indication for her IUD is contraception/cycle control.  IUD Insertion Procedure Note Patient identified, informed consent performed, consent signed.   Discussed risks of irregular bleeding, cramping, infection, malpositioning, expulsion or uterine perforation of the IUD (1:1000 placements)  which may require further procedure such as laparoscopy.  IUD while effective at preventing pregnancy do not prevent transmission of sexually transmitted diseases and use of barrier methods for this purpose was discussed. Time out was performed.  Urine pregnancy test negative.  Speculum placed in the vagina.  Cervix visualized.  Cleaned with Betadine x 2.  Grasped posteriorly with a single tooth tenaculum.  Uterus sounded to 8 cm. IUD placed per manufacturer's recommendations.  Strings trimmed to 3 cm. Tenaculum was removed, good hemostasis noted.  Patient tolerated procedure well.   Patient was given post-procedure instructions.  She was advised to have backup contraception for one week.  Patient was also asked to check IUD strings periodically and follow up in 4-6 weeks for IUD check.  IUD insertion CPT 58300,  Skyla [id number] Mirena [id number] [place] [id number] Verdia Kuba [phone] Modifer 25, plus Modifer 79 is done during a global billing visit    Rod Can, [facility]

## 2019-07-14 NOTE — Patient Instructions (Signed)

## 2019-08-12 ENCOUNTER — Other Ambulatory Visit: Payer: Self-pay

## 2019-08-12 ENCOUNTER — Encounter: Payer: Self-pay | Admitting: Advanced Practice Midwife

## 2019-08-12 ENCOUNTER — Ambulatory Visit: Payer: Managed Care, Other (non HMO) | Admitting: Advanced Practice Midwife

## 2019-08-12 VITALS — BP 100/60 | Ht 68.0 in | Wt 166.0 lb

## 2019-08-12 DIAGNOSIS — Z30431 Encounter for routine checking of intrauterine contraceptive device: Secondary | ICD-10-CM | POA: Diagnosis not present

## 2019-08-12 NOTE — Progress Notes (Signed)
       IUD String Check  Subjctive: [name] presents for IUD string check.  She had a Paragard placed 4 weeks ago.  Since placement of her IUD she had no vaginal bleeding.  She reports minimal cramping or discomfort.  She has had intercourse since placement.  She has not checked the strings.  She denies any fever, chills, nausea, vomiting, or other complaints.    Objective: BP 100/60   Ht 5\' 8"  (1.727 m)   Wt 166 lb (75.3 kg)   LMP 08/19/2018 (Exact Date)   BMI 25.24 kg/m  Gen:  NAD, A&Ox3 HEENT: normocephalic, anicteric Pulmonary: no increased work of breathing Pelvic: Normal appearing external female genitalia, normal vaginal epithelium, no abnormal discharge. Normal appearing cervix.  IUD strings visible and 3 cm in length similar to at the time of placement. Psychiatric: mood appropriate, affect full Neurologic: grossly normal   Assessment: 28 y.o. year old female status post prior Paragard IUD placement 4 weeks ago, doing well.  Plan: 1.  The patient was given instructions to check her IUD strings monthly and call with any problems or concerns.  She should call for fevers, chills, abnormal vaginal discharge, pelvic pain, or other complaints. 2.  She will return for an annual exam in 1 year.  All questions answered.   Rod Can, CNM 08/12/2019 8:46 AM

## 2019-10-19 NOTE — Telephone Encounter (Signed)
Paragard rcvd/charged 07/14/2019

## 2020-04-04 ENCOUNTER — Ambulatory Visit (INDEPENDENT_AMBULATORY_CARE_PROVIDER_SITE_OTHER): Payer: Managed Care, Other (non HMO) | Admitting: Advanced Practice Midwife

## 2020-04-04 ENCOUNTER — Other Ambulatory Visit: Payer: Self-pay

## 2020-04-04 ENCOUNTER — Encounter: Payer: Self-pay | Admitting: Advanced Practice Midwife

## 2020-04-04 VITALS — BP 104/60 | HR 78 | Ht 68.0 in | Wt 151.0 lb

## 2020-04-04 DIAGNOSIS — Z30432 Encounter for removal of intrauterine contraceptive device: Secondary | ICD-10-CM | POA: Diagnosis not present

## 2020-04-04 NOTE — Progress Notes (Signed)
    GYNECOLOGY OFFICE PROCEDURE NOTE  Alexandria Owen is a 29 y.o. G1P1001 here for IUD removal. The patient currently has a Paragard IUD placed in December 2020. She and her husband are planning conception. No GYN concerns.  Last pap smear was on 12/16/2017 and was normal. She has had very regular 30 day cycles while using the Paragard.  Vital Signs: BP 104/60   Pulse 78   Ht 5\' 8"  (1.727 m)   Wt 151 lb (68.5 kg)   LMP 03/25/2020 (Exact Date)   BMI 22.96 kg/m  Constitutional: Well nourished, well developed female in no acute distress.  HEENT: normal Skin: Warm and dry.  Extremities: no edema Respiratory:  Normal respiratory effort Neuro: DTRs 2+, Cranial nerves grossly intact Psych: Alert and Oriented x3. No memory deficits. Normal mood and affect.  MS: normal gait, normal bilateral lower extremity ROM/strength/stability.  Pelvic exam:  is not limited by body habitus EGBUS: within normal limits Vagina: within normal limits and with normal mucosa  Cervix: normal appearance, strings visible    IUD Removal  Patient identified, informed consent performed, consent signed.   Time out was performed. Speculum placed in the vagina. The strings of the IUD were grasped and pulled using ring forceps. The IUD was successfully removed in its entirety.  Patient tolerated procedure well.   Patient was given post-procedure instructions.     Rod Can, [facility]

## 2020-05-11 ENCOUNTER — Other Ambulatory Visit: Payer: Self-pay

## 2020-05-11 ENCOUNTER — Encounter: Payer: Self-pay | Admitting: Dermatology

## 2020-05-11 ENCOUNTER — Ambulatory Visit (INDEPENDENT_AMBULATORY_CARE_PROVIDER_SITE_OTHER): Payer: Managed Care, Other (non HMO) | Admitting: Dermatology

## 2020-05-11 DIAGNOSIS — D239 Other benign neoplasm of skin, unspecified: Secondary | ICD-10-CM

## 2020-05-11 DIAGNOSIS — D2362 Other benign neoplasm of skin of left upper limb, including shoulder: Secondary | ICD-10-CM

## 2020-05-11 DIAGNOSIS — L700 Acne vulgaris: Secondary | ICD-10-CM | POA: Diagnosis not present

## 2020-05-11 DIAGNOSIS — D2371 Other benign neoplasm of skin of right lower limb, including hip: Secondary | ICD-10-CM

## 2020-05-11 DIAGNOSIS — Z1283 Encounter for screening for malignant neoplasm of skin: Secondary | ICD-10-CM | POA: Diagnosis not present

## 2020-05-11 DIAGNOSIS — L578 Other skin changes due to chronic exposure to nonionizing radiation: Secondary | ICD-10-CM

## 2020-05-11 MED ORDER — ADAPALENE 0.3 % EX GEL: 1.0000 "application " | Freq: Every day | CUTANEOUS | 3 refills | Status: DC

## 2020-05-11 MED ORDER — CLINDAMYCIN PHOSPHATE 1 % EX LOTN: TOPICAL_LOTION | Freq: Every day | CUTANEOUS | 3 refills | Status: DC

## 2020-05-11 NOTE — Progress Notes (Deleted)
   New Patient Visit  Subjective  Alexandria Owen is a 29 y.o. female who presents for the following: Annual Exam (No history of skin cancer or abnormal moles) and Acne (face - breakouts that don't seem to clear completely). The patient presents for Total-Body Skin Exam (TBSE) for skin cancer screening and mole check.  The following portions of the chart were reviewed this encounter and updated as appropriate:  Tobacco  Allergies  Meds  Problems  Med Hx  Surg Hx  Fam Hx     Review of Systems:  No other skin or systemic complaints except as noted in HPI or Assessment and Plan.  Objective  Well appearing patient in no apparent distress; mood and affect are within normal limits.  A full examination was performed including scalp, head, eyes, ears, nose, lips, neck, chest, axillae, abdomen, back, buttocks, bilateral upper extremities, bilateral lower extremities, hands, feet, fingers, toes, fingernails, and toenails. All findings within normal limits unless otherwise noted below.  Objective  Left Forearm - Posterior: 0.4 cm dark brown macule  Images      Objective  Right mid lat pretibial: 0.5 cm flesh colored papule   Assessment & Plan    Lentigines - Scattered tan macules - Discussed due to sun exposure - Benign, observe - Call for any changes  Seborrheic Keratoses - Stuck-on, waxy, tan-brown papules and plaques  - Discussed benign etiology and prognosis. - Observe - Call for any changes  Melanocytic Nevi - Tan-brown and/or pink-flesh-colored symmetric macules and papules - Benign appearing on exam today - Observation - Call clinic for new or changing moles - Recommend daily use of broad spectrum spf 30+ sunscreen to sun-exposed areas.   Hemangiomas - Red papules - Discussed benign nature - Observe - Call for any changes  Actinic Damage - diffuse scaly erythematous macules with underlying dyspigmentation - Recommend daily broad spectrum sunscreen SPF 30+  to sun-exposed areas, reapply every 2 hours as needed.  - Call for new or changing lesions.  Skin cancer screening performed today.  Acne vulgaris in a postpartum female who is currently breast-feeding.  She also may want to get pregnant soon. Head - Anterior (Face)  Clindamycin lotion qam, Adapalene 0.3% gel qhs  Adapalene (DIFFERIN) 0.3 % gel - Head - Anterior (Face)  clindamycin (CLEOCIN-T) 1 % lotion - Head - Anterior (Face)  Blue nevus Left Forearm - Posterior Blue nevus vs benign nevus - Benign-appearing.  Observation.  Call clinic for new or changing lesions.  Recommend daily use of broad spectrum spf 30+ sunscreen to sun-exposed areas.     Dermatofibroma Right mid lat pretibial Dermatofibroma vs nevus - Benign-appearing.  Observation.  Call clinic for new or changing lesions.  Recommend daily use of broad spectrum spf 30+ sunscreen to sun-exposed areas.    Return for acne in 3-4 months and TBSE in 1 year.  I, [name], CMA, am acting as scribe for Alexandria Ser, MD .  Documentation: I have reviewed the above documentation for accuracy and completeness, and I agree with the above.  Alexandria Ser, MD

## 2020-05-11 NOTE — Progress Notes (Deleted)
   New Patient Visit  Subjective  Alexandria Owen is a 29 y.o. female who presents for the following: Annual Exam (No history of skin cancer or abnormal moles) and Acne (face - breakouts that don't seem to clear completely). The patient presents for Total-Body Skin Exam (TBSE) for skin cancer screening and mole check.  The following portions of the chart were reviewed this encounter and updated as appropriate:  Tobacco  Allergies  Meds  Problems  Med Hx  Surg Hx  Fam Hx     Review of Systems:  No other skin or systemic complaints except as noted in HPI or Assessment and Plan.  Objective  Well appearing patient in no apparent distress; mood and affect are within normal limits.  A full examination was performed including scalp, head, eyes, ears, nose, lips, neck, chest, axillae, abdomen, back, buttocks, bilateral upper extremities, bilateral lower extremities, hands, feet, fingers, toes, fingernails, and toenails. All findings within normal limits unless otherwise noted below.  Objective  Left Forearm - Posterior: 0.4 cm dark brown macule  Images      Objective  Right mid lat pretibial: 0.5 cm flesh colored papule   Assessment & Plan  Acne vulgaris -in a in a postpartum female who is currently breast-feeding.  She may want to get pregnant again soon.  This limits her treatment options. Face Clindamycin lotion qam, Adapalene 0.3% gel qhs Advised patient to discontinue Adapalene if she becomes pregnant. Discussed oral treatment but advised patient it is not safe while she is breast feeding or should she become pregnant.  Adapalene (DIFFERIN) 0.3 % gel - Face  clindamycin (CLEOCIN-T) 1 % lotion - Face  Blue nevus versus combined nevus-benign-appearing.  This is been the same for years without change Left Forearm - Posterior Blue nevus vs benign nevus - Benign-appearing.  Observation.  Call clinic for new or changing lesions.  Recommend daily use of broad spectrum spf 30+  sunscreen to sun-exposed areas.     Dermatofibroma Right mid lat pretibial Dermatofibroma vs nevus - Benign-appearing.  Observation.  Call clinic for new or changing lesions.  Recommend daily use of broad spectrum spf 30+ sunscreen to sun-exposed areas.    Actinic Damage - diffuse scaly erythematous macules with underlying dyspigmentation - Recommend daily broad spectrum sunscreen SPF 30+ to sun-exposed areas, reapply every 2 hours as needed.  - Call for new or changing lesions.  Skin cancer screening  Return for acne in 3-4 months and TBSE in 1 year.  I, [name], CMA, am acting as scribe for Sarina Ser, MD .  Documentation: I have reviewed the above documentation for accuracy and completeness, and I agree with the above.  Sarina Ser, MD

## 2020-05-19 NOTE — Progress Notes (Deleted)
° °  New Patient Visit  Subjective  Alexandria Owen is a 29 y.o. female who presents for the following: Annual Exam (No history of skin cancer or abnormal moles) and Acne (face - breakouts that don't seem to clear completely).    The following portions of the chart were reviewed this encounter and updated as appropriate:  Tobacco   Allergies   Meds   Problems   Med Hx   Surg Hx   Fam Hx       Review of Systems:  No other skin or systemic complaints except as noted in HPI or Assessment and Plan.  Objective  Well appearing patient in no apparent distress; mood and affect are within normal limits.  A full examination was performed including scalp, head, eyes, ears, nose, lips, neck, chest, axillae, abdomen, back, buttocks, bilateral upper extremities, bilateral lower extremities, hands, feet, fingers, toes, fingernails, and toenails. All findings within normal limits unless otherwise noted below.  Objective  Left Forearm - Posterior: 0.4 cm dark brown macule  Images      Objective  Right mid lat pretibial: 0.5 cm flesh colored papule   Assessment & Plan    Lentigines - Scattered tan macules - Discussed due to sun exposure - Benign, observe - Call for any changes  Seborrheic Keratoses - Stuck-on, waxy, tan-brown papules and plaques  - Discussed benign etiology and prognosis. - Observe - Call for any changes  Melanocytic Nevi - Tan-brown and/or pink-flesh-colored symmetric macules and papules - Benign appearing on exam today - Observation - Call clinic for new or changing moles - Recommend daily use of broad spectrum spf 30+ sunscreen to sun-exposed areas.   Hemangiomas - Red papules - Discussed benign nature - Observe - Call for any changes  Actinic Damage - diffuse scaly erythematous macules with underlying dyspigmentation - Recommend daily broad spectrum sunscreen SPF 30+ to sun-exposed areas, reapply every 2 hours as needed.  - Call for new or changing  lesions.  Skin cancer screening performed today.   Acne vulgaris Face  Clindamycin lotion qam, Adapalene 0.3% gel qhs  Advised patient to discontinue Adapalene if she becomes pregnant.  Discussed oral treatment but advised patient it is not safe while she is breast feeding or should she become pregnant.  Adapalene (DIFFERIN) 0.3 % gel - Face  clindamycin (CLEOCIN-T) 1 % lotion - Face  Blue nevus Left Forearm - Posterior  Blue nevus vs benign nevus - Benign-appearing.  Observation.  Call clinic for new or changing lesions.  Recommend daily use of broad spectrum spf 30+ sunscreen to sun-exposed areas.     Dermatofibroma Right mid lat pretibial  Dermatofibroma vs nevus - Benign-appearing.  Observation.  Call clinic for new or changing lesions.  Recommend daily use of broad spectrum spf 30+ sunscreen to sun-exposed areas.    Skin cancer screening  Return for acne in 3-4 months and TBSE in 1 year.  I, [name], CMA, am acting as scribe for Sarina Ser, MD .

## 2020-05-27 ENCOUNTER — Ambulatory Visit: Payer: Managed Care, Other (non HMO) | Admitting: Advanced Practice Midwife

## 2020-06-14 NOTE — Progress Notes (Signed)
   New Patient Visit  Subjective  Alexandria Owen is a 29 y.o. female who presents for the following: Annual Exam (No history of skin cancer or abnormal moles) and Acne (face - breakouts that don't seem to clear completely). The patient presents for Total-Body Skin Exam (TBSE) for skin cancer screening and mole check.  The following portions of the chart were reviewed this encounter and updated as appropriate:  Tobacco  Allergies  Meds  Problems  Med Hx  Surg Hx  Fam Hx     Review of Systems:  No other skin or systemic complaints except as noted in HPI or Assessment and Plan.  Objective  Well appearing patient in no apparent distress; mood and affect are within normal limits.  A full examination was performed including scalp, head, eyes, ears, nose, lips, neck, chest, axillae, abdomen, back, buttocks, bilateral upper extremities, bilateral lower extremities, hands, feet, fingers, toes, fingernails, and toenails. All findings within normal limits unless otherwise noted below.  Objective  Left Forearm - Posterior: 0.4 cm dark brown macule  Images      Objective  Right mid lat pretibial: 0.5 cm flesh colored papule   Assessment & Plan    Lentigines - Scattered tan macules - Discussed due to sun exposure - Benign, observe - Call for any changes  Seborrheic Keratoses - Stuck-on, waxy, tan-brown papules and plaques  - Discussed benign etiology and prognosis. - Observe - Call for any changes  Melanocytic Nevi - Tan-brown and/or pink-flesh-colored symmetric macules and papules - Benign appearing on exam today - Observation - Call clinic for new or changing moles - Recommend daily use of broad spectrum spf 30+ sunscreen to sun-exposed areas.   Hemangiomas - Red papules - Discussed benign nature - Observe - Call for any changes  Actinic Damage - diffuse scaly erythematous macules with underlying dyspigmentation - Recommend daily broad spectrum sunscreen SPF 30+  to sun-exposed areas, reapply every 2 hours as needed.  - Call for new or changing lesions.  Skin cancer screening performed today.  Acne vulgaris - chronic persistent.  Complicated by pt breastfeeding - cannot use systemic treatment. Face Clindamycin lotion qam, Adapalene 0.3% gel qhs Advised patient to discontinue Adapalene if she becomes pregnant.  Discussed oral treatment but advised patient it is not safe while she is breast feeding or should she become pregnant.  Adapalene (DIFFERIN) 0.3 % gel - Face clindamycin (CLEOCIN-T) 1 % lotion - Face  Blue nevus Left Forearm - Posterior  Blue nevus vs benign nevus - Benign-appearing.  Observation.  Call clinic for new or changing lesions.  Recommend daily use of broad spectrum spf 30+ sunscreen to sun-exposed areas.     Dermatofibroma Right mid lat pretibial  Dermatofibroma vs nevus - Benign-appearing.  Observation.  Call clinic for new or changing lesions.  Recommend daily use of broad spectrum spf 30+ sunscreen to sun-exposed areas.    Skin cancer screening  Return for acne in 3-4 months and TBSE in 1 year.  I, [name], CMA, am acting as scribe for Sarina Ser, MD .  Documentation: I have reviewed the above documentation for accuracy and completeness, and I agree with the above.  Sarina Ser, MD

## 2020-06-24 ENCOUNTER — Other Ambulatory Visit: Payer: Self-pay

## 2020-06-24 ENCOUNTER — Encounter: Payer: Self-pay | Admitting: Obstetrics & Gynecology

## 2020-06-24 ENCOUNTER — Ambulatory Visit (INDEPENDENT_AMBULATORY_CARE_PROVIDER_SITE_OTHER): Payer: Managed Care, Other (non HMO) | Admitting: Obstetrics & Gynecology

## 2020-06-24 VITALS — BP 100/60 | Wt 149.0 lb

## 2020-06-24 DIAGNOSIS — Z124 Encounter for screening for malignant neoplasm of cervix: Secondary | ICD-10-CM

## 2020-06-24 DIAGNOSIS — O209 Hemorrhage in early pregnancy, unspecified: Secondary | ICD-10-CM

## 2020-06-24 DIAGNOSIS — Z3A08 8 weeks gestation of pregnancy: Secondary | ICD-10-CM

## 2020-06-24 DIAGNOSIS — Z3480 Encounter for supervision of other normal pregnancy, unspecified trimester: Secondary | ICD-10-CM | POA: Insufficient documentation

## 2020-06-24 DIAGNOSIS — Z369 Encounter for antenatal screening, unspecified: Secondary | ICD-10-CM

## 2020-06-24 DIAGNOSIS — Z3491 Encounter for supervision of normal pregnancy, unspecified, first trimester: Secondary | ICD-10-CM

## 2020-06-24 LAB — POCT URINALYSIS DIPSTICK OB
Glucose, UA: NEGATIVE
POC,PROTEIN,UA: NEGATIVE

## 2020-06-24 LAB — POCT URINE PREGNANCY: Preg Test, Ur: POSITIVE — AB

## 2020-06-24 NOTE — Progress Notes (Signed)
06/24/2020   Chief Complaint: Missed period  Transfer of Care Patient: no  History of Present Illness: Ms. Coulibaly is a 29 y.o. G2P1001 [redacted]w[redacted]d based on Patient's last menstrual period was 04/29/2020. with an Estimated Date of Delivery: 02/03/21, with the above CC.   Her periods were: regular periods every 28 days even on IUD, which was removed in Aug She was using no method when she conceived.  She has Positive signs or symptoms of nausea/vomiting of pregnancy. She has Negative signs or symptoms of miscarriage or preterm labor She identifies Negative Zika risk factors for her and her partner On any different medications around the time she conceived/early pregnancy: No  History of varicella: Yes   ROS: A 12-point review of systems was performed and negative, except as stated in the above HPI.  OBGYN History: As per HPI. OB History  Gravida Para Term Preterm AB Living  2 1 1  0 0 1  SAB TAB Ectopic Multiple Live Births  0 0 0 0 1    # Outcome Date GA Lbr Len/2nd Weight Sex Delivery Anes PTL Lv  2 Current           1 Term 06/05/19 [redacted]w[redacted]d / 02:09 8 lb 1.8 oz (3.68 kg) M Vag-Spont EPI  LIV    Any issues with any prior pregnancies: yes Any prior children are healthy, doing well, without any problems or issues: yes History of pap smears: Yes. Last pap smear 2019. Abnormal: no  History of STIs: No   Past Medical History: Past Medical History:  Diagnosis Date   Allergic rhinitis    Vaccine for human papilloma virus (HPV) types 6, 11, 16, and 18 administered     Past Surgical History: History reviewed. No pertinent surgical history.  Family History:  Family History  Problem Relation Age of Onset   Breast cancer Neg Hx    Ovarian cancer Neg Hx    She denies any female cancers, bleeding or blood clotting disorders.  She denies any history of mental retardation, birth defects or genetic disorders in her or the FOB's history  Social History:  Social History   Socioeconomic  History   Marital status: Married    Spouse name: Not on file   Number of children: Not on file   Years of education: Not on file   Highest education level: Not on file  Occupational History   Not on file  Tobacco Use   Smoking status: Never Smoker   Smokeless tobacco: Never Used  Vaping Use   Vaping Use: Never used  Substance and Sexual Activity   Alcohol use: Not Currently    Comment: occasional   Drug use: No   Sexual activity: Yes    Birth control/protection: None    Comment: Paragard  Other Topics Concern   Not on file  Social History Narrative   Not on file   Social Determinants of Health   Financial Resource Strain:    Difficulty of Paying Living Expenses: Not on file  Food Insecurity:    Worried About Charity fundraiser in the Last Year: Not on file   [employer] of Food in the Last Year: Not on file  Transportation Needs:    Lack of Transportation (Medical): Not on file   Lack of Transportation (Non-Medical): Not on file  Physical Activity:    Days of Exercise per Week: Not on file   Minutes of Exercise per Session: Not on file  Stress:    Feeling  of Stress : Not on file  Social Connections:    Frequency of Communication with Friends and Family: Not on file   Frequency of Social Gatherings with Friends and Family: Not on file   Attends Religious Services: Not on file   Active Member of Clubs or Organizations: Not on file   Attends Archivist Meetings: Not on file   Marital Status: Not on file  Intimate Partner Violence:    Fear of Current or Ex-Partner: Not on file   Emotionally Abused: Not on file   Physically Abused: Not on file   Sexually Abused: Not on file   Any pets in the household: no  Allergy: No Known Allergies  Current Outpatient Medications:  Current Outpatient Medications:    Prenatal Vit-Fe Fumarate-FA (MULTIVITAMIN-PRENATAL) 27-0.8 MG TABS tablet, Take 1 tablet by mouth daily at 12 noon., Disp:  , Rfl:    Physical Exam:   BP 100/60    Wt 149 lb (67.6 kg)    LMP 04/29/2020    BMI 22.66 kg/m  Body mass index is 22.66 kg/m. Constitutional: Well nourished, well developed female in no acute distress.  Neck:  Supple, normal appearance, and no thyromegaly  Cardiovascular: S1, S2 normal, no murmur, rub or gallop, regular rate and rhythm Respiratory:  Clear to auscultation bilateral. Normal respiratory effort Abdomen: positive bowel sounds and no masses, hernias; diffusely non tender to palpation, non distended Breasts: breasts appear normal, no suspicious masses, no skin or nipple changes or axillary nodes. Neuro/Psych:  Normal mood and affect.  Skin:  Warm and dry.  Lymphatic:  No inguinal lymphadenopathy.   Pelvic exam: is not limited by body habitus EGBUS: within normal limits, Vagina: within normal limits and with no blood in the vault, Cervix: normal appearing cervix without discharge or lesions, closed/long/high, Uterus:  enlarged: 8 weeks, and Adnexa:  normal adnexa  Assessment: Ms. Bai is a 29 y.o. G2P1001 [redacted]w[redacted]d based on Patient's last menstrual period was 04/29/2020. with an Estimated Date of Delivery: 02/03/21,  for prenatal care.  Plan:  1) Avoid alcoholic beverages. 2) Patient encouraged not to smoke.  3) Discontinue the use of all non-medicinal drugs and chemicals.  4) Take prenatal vitamins daily.  5) Seatbelt use advised 6) Nutrition, food safety (fish, cheese advisories, and high nitrite foods) and exercise discussed. 7) Hospital and practice style delivering at [facility] discussed  8) Patient is asked about travel to areas at risk for the [place] virus, and counseled to avoid travel and exposure to mosquitoes or sexual partners who may have themselves been exposed to the virus. Testing is discussed, and will be ordered as appropriate.  9) Childbirth classes at [facility] advised 10) Genetic Screening, such as with 1st Trimester Screening, cell free fetal DNA, AFP testing, and  Ultrasound, as well as with amniocentesis and CVS as appropriate, is discussed with patient. She plans to have genetic testing this pregnancy.  Problem list reviewed and updated.  Barnett Applebaum, MD, [facility], [facility] 06/24/2020  10:53 AM  ADDENDUM, [place]:  ULTRASOUND REPORT  Location: [facility] Date of Service: 06/24/2020   Indications:Threatened AB Findings:  Singleton intrauterine pregnancy is visualized with a CRL consistent with [redacted]w[redacted]d gestation, giving an (U/S) EDD of 02/09/21. The (U/S) EDD is consistent with the clinically established EDD of 02/03/21.  FHR: 150 BPM CRL measurement: 10 mm Yolk sac is not visualized. Amnion: not visualized   Right Ovary is normal in appearance. Left Ovary is normal appearance. Corpus  luteal cyst:  is not visualized Survey of the adnexa demonstrates no adnexal masses. There is no free peritoneal fluid in the cul de sac.  Impression: 1. [redacted]w[redacted]d Viable Singleton Intrauterine pregnancy by U/S. 2. (U/S) EDD is consistent with Clinically established EDD of 02/03/21.  Recommendations: 1.Clinical correlation with the patient's History and Physical Exam. 2. US done by [facility]  Hoyt Koch, MD

## 2020-06-24 NOTE — Patient Instructions (Signed)
First Trimester of Pregnancy The first trimester of pregnancy is from week 1 until the end of week 13 (months 1 through 3). A week after a sperm fertilizes an egg, the egg will implant on the wall of the uterus. This embryo will begin to develop into a baby. Genes from you and your partner will form the baby. The female genes will determine whether the baby will be a boy or a girl. At 6-8 weeks, the eyes and face will be formed, and the heartbeat can be seen on ultrasound. At the end of 12 weeks, all the baby's organs will be formed. Now that you are pregnant, you will want to do everything you can to have a healthy baby. Two of the most important things are to get good prenatal care and to follow your health care provider's instructions. Prenatal care is all the medical care you receive before the baby's birth. This care will help prevent, find, and treat any problems during the pregnancy and childbirth. Body changes during your first trimester Your body goes through many changes during pregnancy. The changes vary from woman to woman.  You may gain or lose a couple of pounds at first.  You may feel sick to your stomach (nauseous) and you may throw up (vomit). If the vomiting is uncontrollable, call your health care provider.  You may tire easily.  You may develop headaches that can be relieved by medicines. All medicines should be approved by your health care provider.  You may urinate more often. Painful urination may mean you have a bladder infection.  You may develop heartburn as a result of your pregnancy.  You may develop constipation because certain hormones are causing the muscles that push stool through your intestines to slow down.  You may develop hemorrhoids or swollen veins (varicose veins).  Your breasts may begin to grow larger and become tender. Your nipples may stick out more, and the tissue that surrounds them (areola) may become darker.  Your gums may bleed and may be  sensitive to brushing and flossing.  Dark spots or blotches (chloasma, mask of pregnancy) may develop on your face. This will likely fade after the baby is born.  Your menstrual periods will stop.  You may have a loss of appetite.  You may develop cravings for certain kinds of food.  You may have changes in your emotions from day to day, such as being excited to be pregnant or being concerned that something may go wrong with the pregnancy and baby.  You may have more vivid and strange dreams.  You may have changes in your hair. These can include thickening of your hair, rapid growth, and changes in texture. Some women also have hair loss during or after pregnancy, or hair that feels dry or thin. Your hair will most likely return to normal after your baby is born. What to expect at prenatal visits During a routine prenatal visit:  You will be weighed to make sure you and the baby are growing normally.  Your blood pressure will be taken.  Your abdomen will be measured to track your baby's growth.  The fetal heartbeat will be listened to between weeks 10 and 14 of your pregnancy.  Test results from any previous visits will be discussed. Your health care provider may ask you:  How you are feeling.  If you are feeling the baby move.  If you have had any abnormal symptoms, such as leaking fluid, bleeding, severe headaches, or abdominal   cramping.  If you are using any tobacco products, including cigarettes, chewing tobacco, and electronic cigarettes.  If you have any questions. Other tests that may be performed during your first trimester include:  Blood tests to find your blood type and to check for the presence of any previous infections. The tests will also be used to check for low iron levels (anemia) and protein on red blood cells (Rh antibodies). Depending on your risk factors, or if you previously had diabetes during pregnancy, you may have tests to check for high blood sugar  that affects pregnant women (gestational diabetes).  Urine tests to check for infections, diabetes, or protein in the urine.  An ultrasound to confirm the proper growth and development of the baby.  Fetal screens for spinal cord problems (spina bifida) and Down syndrome.  HIV (human immunodeficiency virus) testing. Routine prenatal testing includes screening for HIV, unless you choose not to have this test.  You may need other tests to make sure you and the baby are doing well. Follow these instructions at home: Medicines  Follow your health care provider's instructions regarding medicine use. Specific medicines may be either safe or unsafe to take during pregnancy.  Take a prenatal vitamin that contains at least 600 micrograms (mcg) of folic acid.  If you develop constipation, try taking a stool softener if your health care provider approves. Eating and drinking   Eat a balanced diet that includes fresh fruits and vegetables, whole grains, good sources of protein such as meat, eggs, or tofu, and low-fat dairy. Your health care provider will help you determine the amount of weight gain that is right for you.  Avoid raw meat and uncooked cheese. These carry germs that can cause birth defects in the baby.  Eating four or five small meals rather than three large meals a day may help relieve nausea and vomiting. If you start to feel nauseous, eating a few soda crackers can be helpful. Drinking liquids between meals, instead of during meals, also seems to help ease nausea and vomiting.  Limit foods that are high in fat and processed sugars, such as fried and sweet foods.  To prevent constipation: ? Eat foods that are high in fiber, such as fresh fruits and vegetables, whole grains, and beans. ? Drink enough fluid to keep your urine clear or pale yellow. Activity  Exercise only as directed by your health care provider. Most women can continue their usual exercise routine during  pregnancy. Try to exercise for 30 minutes at least 5 days a week. Exercising will help you: ? Control your weight. ? Stay in shape. ? Be prepared for labor and delivery.  Experiencing pain or cramping in the lower abdomen or lower back is a good sign that you should stop exercising. Check with your health care provider before continuing with normal exercises.  Try to avoid standing for long periods of time. Move your legs often if you must stand in one place for a long time.  Avoid heavy lifting.  Wear low-heeled shoes and practice good posture.  You may continue to have sex unless your health care provider tells you not to. Relieving pain and discomfort  Wear a good support bra to relieve breast tenderness.  Take warm sitz baths to soothe any pain or discomfort caused by hemorrhoids. Use hemorrhoid cream if your health care provider approves.  Rest with your legs elevated if you have leg cramps or low back pain.  If you develop varicose veins in   your legs, wear support hose. Elevate your feet for 15 minutes, 3-4 times a day. Limit salt in your diet. Prenatal care  Schedule your prenatal visits by the twelfth week of pregnancy. They are usually scheduled monthly at first, then more often in the last 2 months before delivery.  Write down your questions. Take them to your prenatal visits.  Keep all your prenatal visits as told by your health care provider. This is important. Safety  Wear your seat belt at all times when driving.  Make a list of emergency phone numbers, including numbers for family, friends, the hospital, and police and fire departments. General instructions  Ask your health care provider for a referral to a local prenatal education class. Begin classes no later than the beginning of month 6 of your pregnancy.  Ask for help if you have counseling or nutritional needs during pregnancy. Your health care provider can offer advice or refer you to specialists for help  with various needs.  Do not use hot tubs, steam rooms, or saunas.  Do not douche or use tampons or scented sanitary pads.  Do not cross your legs for long periods of time.  Avoid cat litter boxes and soil used by cats. These carry germs that can cause birth defects in the baby and possibly loss of the fetus by miscarriage or stillbirth.  Avoid all smoking, herbs, alcohol, and medicines not prescribed by your health care provider. Chemicals in these products affect the formation and growth of the baby.  Do not use any products that contain nicotine or tobacco, such as cigarettes and e-cigarettes. If you need help quitting, ask your health care provider. You may receive counseling support and other resources to help you quit.  Schedule a dentist appointment. At home, brush your teeth with a soft toothbrush and be gentle when you floss. Contact a health care provider if:  You have dizziness.  You have mild pelvic cramps, pelvic pressure, or nagging pain in the abdominal area.  You have persistent nausea, vomiting, or diarrhea.  You have a bad smelling vaginal discharge.  You have pain when you urinate.  You notice increased swelling in your face, hands, legs, or ankles.  You are exposed to fifth disease or chickenpox.  You are exposed to German measles (rubella) and have never had it. Get help right away if:  You have a fever.  You are leaking fluid from your vagina.  You have spotting or bleeding from your vagina.  You have severe abdominal cramping or pain.  You have rapid weight gain or loss.  You vomit blood or material that looks like coffee grounds.  You develop a severe headache.  You have shortness of breath.  You have any kind of trauma, such as from a fall or a car accident. Summary  The first trimester of pregnancy is from week 1 until the end of week 13 (months 1 through 3).  Your body goes through many changes during pregnancy. The changes vary from  woman to woman.  You will have routine prenatal visits. During those visits, your health care provider will examine you, discuss any test results you may have, and talk with you about how you are feeling. This information is not intended to replace advice given to you by your health care provider. Make sure you discuss any questions you have with your health care provider. Document Revised: 07/12/2017 Document Reviewed: 07/11/2016 Elsevier Patient Education  2020 [employer]vier Inc.  

## 2020-06-25 LAB — RPR+RH+ABO+RUB AB+AB SCR+CB...
Antibody Screen: NEGATIVE
HIV Screen 4th Generation wRfx: NONREACTIVE
Hematocrit: 39.6 % (ref 34.0–46.6)
Hemoglobin: 13.7 g/dL (ref 11.1–15.9)
Hepatitis B Surface Ag: NEGATIVE
MCH: 29.8 pg (ref 26.6–33.0)
MCHC: 34.6 g/dL (ref 31.5–35.7)
MCV: 86 fL (ref 79–97)
Platelets: 275 10*3/uL (ref 150–450)
RBC: 4.59 x10E6/uL (ref 3.77–5.28)
RDW: 12.3 % (ref 11.7–15.4)
RPR Ser Ql: NONREACTIVE
Rh Factor: POSITIVE
Rubella Antibodies, IGG: 1.56 index (ref >0.99)
Varicella zoster IgG: 187 index (ref >165)
WBC: 7.5 10*3/uL (ref 3.4–10.8)

## 2020-06-26 LAB — URINE CULTURE: Organism ID, Bacteria: NO GROWTH

## 2020-06-28 LAB — IGP,CTNG,APTIMAHPV
Chlamydia, Nuc. Acid Amp: NEGATIVE
Gonococcus by Nucleic Acid Amp: NEGATIVE
HPV Aptima: NEGATIVE

## 2020-06-29 ENCOUNTER — Telehealth: Payer: Self-pay

## 2020-06-29 NOTE — Telephone Encounter (Signed)
Pt calling; 8-9wks; tiny bit of bleeding in the last 24hrs like she had 5d ago; just with wiping; is concerned.  [phone]  Pt admits to IC 24-36hrs prior to spotting; it's only when she wipes; at what point to be concerned.  Adv to be seen if flow becomes like a period.  Pt reassured.

## 2020-07-11 ENCOUNTER — Ambulatory Visit (INDEPENDENT_AMBULATORY_CARE_PROVIDER_SITE_OTHER): Payer: Managed Care, Other (non HMO)

## 2020-07-11 ENCOUNTER — Ambulatory Visit (INDEPENDENT_AMBULATORY_CARE_PROVIDER_SITE_OTHER): Payer: Managed Care, Other (non HMO) | Admitting: Obstetrics & Gynecology

## 2020-07-11 ENCOUNTER — Encounter: Payer: Self-pay | Admitting: Obstetrics & Gynecology

## 2020-07-11 ENCOUNTER — Other Ambulatory Visit: Payer: Self-pay | Admitting: Obstetrics & Gynecology

## 2020-07-11 ENCOUNTER — Other Ambulatory Visit: Payer: Self-pay

## 2020-07-11 VITALS — BP 100/60 | Wt 148.0 lb

## 2020-07-11 DIAGNOSIS — Z3A10 10 weeks gestation of pregnancy: Secondary | ICD-10-CM

## 2020-07-11 DIAGNOSIS — Z3491 Encounter for supervision of normal pregnancy, unspecified, first trimester: Secondary | ICD-10-CM | POA: Diagnosis not present

## 2020-07-11 DIAGNOSIS — Z1379 Encounter for other screening for genetic and chromosomal anomalies: Secondary | ICD-10-CM

## 2020-07-11 NOTE — Progress Notes (Signed)
°  Subjective  Fetal Movement? no Pain? no Nausea? mild Vaginal Bleeding? no  Objective  BP 100/60    Wt 148 lb (67.1 kg)    LMP 04/29/2020    BMI 22.50 kg/m  General: NAD Pumonary: no increased work of breathing Abdomen: gravid, non-tender Extremities: no edema Psychiatric: mood appropriate, affect full  Review of ULTRASOUND.    I have personally reviewed images and report of recent ultrasound done at [facility].    Plan of management to be discussed with patient.    Keep EDC  Assessment  29 y.o. G2P1001 at [redacted]w[redacted]d by  02/03/2021, by Last Menstrual Period presenting for routine prenatal visit  Plan   Problem List Items Addressed This Visit    Supervision of low-risk pregnancy, first trimester   [redacted] weeks gestation of pregnancy       PNV    Monitor nausea    [place] discussed   Encounter for genetic screening for Down Syndrome       Relevant Orders   MaterniT21 PLUS Core+SCA      pregnancy2 Problems (from 04/29/20 to present)    Problem Noted Resolved   Supervision of low-risk pregnancy, first trimester 06/24/2020 by Gae Dry, MD No   Overview Addendum 07/11/2020  2:26 PM by Gae Dry, MD    Clinic [facility] Prenatal Labs  Dating LMP after IUD removal, [place] confirmed Blood type: O/Positive/-- (11/12 1228)   Genetic Screen  NIPS: Antibody:Negative (11/12 1228)  Anatomic [place]  Rubella: 1.56 (11/12 1228) Varicella: Imm  GTT   Third trimester:  RPR: Non Reactive (11/12 1228)   Rhogam n/a HBsAg: Negative (11/12 1228)   TDaP vaccine  03/2019    Flu Shot: HIV: Non Reactive (11/12 1228)   Baby Food   Breast                          GBS:   Contraception  Pap:06/24/20  CBB  no   CS/VBAC n/a   Support Person Dickey Gave, MD, [facility], [facility] 07/11/2020  2:27 PM

## 2020-07-11 NOTE — Patient Instructions (Signed)
Genetic Testing During Pregnancy Genetic testing during pregnancy is also called prenatal genetic testing. This type of testing can determine if your baby is at risk of being born with a disorder caused by abnormal genes or chromosomes (genetic disorder). Chromosomes contain genes that control how your baby will develop in your womb. There are many different genetic disorders. Examples of genetic disorders that may be found through genetic testing include Down syndrome and cystic fibrosis. Gene changes (mutations) can be passed down through families. Genetic testing is offered to all women before or during pregnancy. You can choose whether to have genetic testing. Why is genetic testing done? Genetic testing is done during pregnancy to find out whether your child is at risk for a genetic disorder. Having genetic testing allows you to:  Discuss your test results and options with a genetic counselor.  Prepare for a baby that may be born with a genetic disorder. Learning about the disorder ahead of time helps you be better prepared to manage it. Your health care providers can also be prepared in case your baby requires special care before or after birth.  Consider whether you want to continue with the pregnancy. In some cases, genetic testing may be done to learn about the traits a child will inherit. Types of genetic tests There are two basic types of genetic testing. Screening tests indicate whether your developing baby (fetus) is at higher risk for a genetic disorder. Diagnostic tests check actual fetal cells to diagnose a genetic disorder. Screening tests     Screening tests will not harm your baby. They are recommended for all pregnant women. Types of screening tests include:  Carrier screening. This test involves checking genes from both parents by testing their blood or saliva. The test checks to find out if the parents carry a genetic mutation that may be passed to a baby. In most cases,  both parents must carry the mutation for a baby to be at risk.  First trimester screening. This test combines a blood test with sound wave imaging of your baby (fetal ultrasound). This screening test checks for a risk of Down syndrome or other defects caused by having extra chromosomes. It also checks for defects of the heart, abdomen, or skeleton.  Second trimester screening also combines a blood test with a fetal ultrasound exam. It checks for a risk of genetic defects of the face, brain, spine, heart, or limbs.  Combined or sequential screening. This type of testing combines the results of first and second trimester screening. This type of testing may be more accurate than first or second trimester screening alone.  Cell-free DNA testing. This is a blood test that detects cells released by the placenta that get into the mother's blood. It can be used to check for a risk of Down syndrome, other extra chromosome syndromes, and disorders caused by abnormal numbers of sex chromosomes. This test can be done any time after 10 weeks of pregnancy.  Diagnostic tests Diagnostic tests carry slight risks of problems, including bleeding, infection, and loss of the pregnancy. These tests are done only if your baby is at risk for a genetic disorder. You may meet with a genetic counselor to discuss the risks and benefits before having diagnostic tests. Examples of diagnostic tests include:  Chorionic villus sampling (CVS). This involves a procedure to remove and test a sample of cells taken from the placenta. The procedure may be done between 10 and 12 weeks of pregnancy.  Amniocentesis. This involves a  procedure to remove and test a sample of fluid (amniotic fluid) and cells from the sac that surrounds the developing baby. The procedure may be done between 15 and 20 weeks of pregnancy. What do the results mean? For a screening test:  If the results are negative, it often means that your child is not at higher  risk. There is still a slight chance your child could have a genetic disorder.  If the results are positive, it does not mean your child will have a genetic disorder. It may mean that your child has a higher-than-normal risk for a genetic disorder. In that case, you may want to talk with a genetic counselor about whether you should have diagnostic genetic tests. For a diagnostic test:  If the result is negative, it is unlikely that your child will have a genetic disorder.  If the test is positive for a genetic disorder, it is likely that your child will have the disorder. The test may not tell how severe the disorder will be. Talk with your health care provider about your options. Questions to ask your health care provider Before talking to your health care provider about genetic testing, find out if there is a history of genetic disorders in your family. It may also help to know your family's ethnic origins. Then ask your health care provider the following questions:  Is my baby at risk for a genetic disorder?  What are the benefits of having genetic screening?  What tests are best for me and my baby?  What are the risks of each test?  If I get a positive result on a screening test, what is the next step?  Should I meet with a genetic counselor before having a diagnostic test?  Should my partner or other members of my family be tested?  How much do the tests cost? Will my insurance cover the testing? Summary  Genetic testing is done during pregnancy to find out whether your child is at risk for a genetic disorder.  Genetic testing is offered to all women before or during pregnancy. You can choose whether to have genetic testing.  There are two basic types of genetic testing. Screening tests indicate whether your developing baby (fetus) is at higher risk for a genetic disorder. Diagnostic tests check actual fetal cells to diagnose a genetic disorder.  If a diagnostic genetic test is  positive, talk with your health care provider about your options. This information is not intended to replace advice given to you by your health care provider. Make sure you discuss any questions you have with your health care provider. Document Revised: 11/20/2018 Document Reviewed: 10/14/2017 Elsevier Patient Education  [place].

## 2020-07-15 LAB — MATERNIT21 PLUS CORE+SCA
Fetal Fraction: 5
Monosomy X (Turner Syndrome): NOT DETECTED
Result (T21): NEGATIVE
Trisomy 13 (Patau syndrome): NEGATIVE
Trisomy 18 (Edwards syndrome): NEGATIVE
Trisomy 21 (Down syndrome): NEGATIVE
XXX (Triple X Syndrome): NOT DETECTED
XXY (Klinefelter Syndrome): NOT DETECTED
XYY (Jacobs Syndrome): NOT DETECTED

## 2020-08-13 NOTE — L&D Delivery Note (Addendum)
Date of delivery: 02/05/2021  Estimated Date of Delivery: 02/03/21 Patient's last menstrual period was 04/29/2020. EGA: [redacted]w[redacted]d  Delivery Note At 2:39 AM a viable female was delivered via Vaginal, Spontaneous Presentation:  Occiput Posterior, ROP.  APGAR: 7, 9; weight: 4150g, 9#2oz    Placenta status: Spontaneous, Intact.   Cord: 3 vessels with the following complications: Short.  Cord pH: NA  Patient feeling an increase in pressure- anterior lip noted and easily reduced with strong pushes. Mom pushed to deliver a viable female infant in direct OP.  The head followed by shoulders, which delivered without difficulty, and the rest of the body.  No nuchal cord noted.  Baby to mom's chest.  Cord clamped and cut after 3 min delay.  Cord blood obtained.  Placenta delivered spontaneously, intact, with a 3-vessel cord.   All counts correct.  Hemostasis obtained with IV pitocin and fundal massage.   Anesthesia: Epidural Episiotomy: None Lacerations: Periurethral abrasion Suture Repair:  NA Est. Blood Loss (mL): 500  Mom to postpartum.  Baby to Couplet care / Skin to Skin.  Rod Can, CNM 02/05/2021, 3:08 AM

## 2020-08-15 ENCOUNTER — Ambulatory Visit (INDEPENDENT_AMBULATORY_CARE_PROVIDER_SITE_OTHER): Payer: Managed Care, Other (non HMO) | Admitting: Obstetrics and Gynecology

## 2020-08-15 ENCOUNTER — Other Ambulatory Visit: Payer: Self-pay

## 2020-08-15 ENCOUNTER — Encounter: Payer: Self-pay | Admitting: Obstetrics and Gynecology

## 2020-08-15 VITALS — BP 118/74 | Wt 156.0 lb

## 2020-08-15 DIAGNOSIS — Z3A15 15 weeks gestation of pregnancy: Secondary | ICD-10-CM

## 2020-08-15 DIAGNOSIS — Z3492 Encounter for supervision of normal pregnancy, unspecified, second trimester: Secondary | ICD-10-CM

## 2020-08-15 NOTE — Progress Notes (Signed)
No vb. No lof.  

## 2020-08-15 NOTE — Progress Notes (Signed)
    Routine Prenatal Care Visit  Subjective  Alexandria Owen is a 30 y.o. G2P1001 at [redacted]w[redacted]d being seen today for ongoing prenatal care.  She is currently monitored for the following issues for this low-risk pregnancy and has Heart palpitations and Supervision of low-risk pregnancy, first trimester on their problem list.  ----------------------------------------------------------------------------------- Patient reports no complaints.    . Vag. Bleeding: None.   . Denies leaking of fluid.  ----------------------------------------------------------------------------------- The following portions of the patient's history were reviewed and updated as appropriate: allergies, current medications, past family history, past medical history, past social history, past surgical history and problem list. Problem list updated.   Objective  Blood pressure 118/74, weight 156 lb (70.8 kg), last menstrual period 04/29/2020, currently breastfeeding. Pregravid weight 154 lb (69.9 kg) Total Weight Gain 2 lb (0.907 kg) Urinalysis:      Fetal Status: Fetal Heart Rate (bpm): 150         General:  Alert, oriented and cooperative. Patient is in no acute distress.  Skin: Skin is warm and dry. No rash noted.   Cardiovascular: Normal heart rate noted  Respiratory: Normal respiratory effort, no problems with respiration noted  Abdomen: Soft, gravid, appropriate for gestational age. Pain/Pressure: Absent     Pelvic:  Cervical exam deferred        Extremities: Normal range of motion.     ental Status: Normal mood and affect. Normal behavior. Normal judgment and thought content.     Assessment   30 y.o. G2P1001 at [redacted]w[redacted]d by  02/03/2021, by Last Menstrual Period presenting for routine prenatal visit  Plan   pregnancy2 Problems (from 04/29/20 to present)    Problem Noted Resolved   Supervision of low-risk pregnancy, first trimester 06/24/2020 by Nadara Mustard, MD No   Overview Addendum 07/11/2020  2:26 PM by  Nadara Mustard, MD    Clinic [facility] Prenatal Labs  Dating LMP after IUD removal, [place] confirmed Blood type: O/Positive/-- (11/12 1228)   Genetic Screen  NIPS: wnl XX Antibody:Negative (11/12 1228)  Anatomic [place]  Rubella: 1.56 (11/12 1228) Varicella: Imm  GTT   Third trimester:  RPR: Non Reactive (11/12 1228)   Rhogam n/a HBsAg: Negative (11/12 1228)   TDaP vaccine  03/2019    Flu Shot: HIV: Non Reactive (11/12 1228)   Baby Food   Breast                          GBS:   Contraception  Pap:06/24/20  CBB  no   CS/VBAC n/a   Support Person [name]           Previous Version       -Patient with questions regarding un-medicated childbirth - reviewed sources for additional information.  Second trimester obstetric precautions including but not limited to vaginal bleeding, contractions, leaking of fluid and fetal movement were reviewed in detail with the patient.    Return in about 4 weeks (around 09/12/2020) for ROB and anat scan.  [name], CNM, MSN [facility], [facility] 08/15/2020, 10:27 AM

## 2020-09-13 ENCOUNTER — Ambulatory Visit (INDEPENDENT_AMBULATORY_CARE_PROVIDER_SITE_OTHER): Payer: Managed Care, Other (non HMO)

## 2020-09-13 ENCOUNTER — Other Ambulatory Visit: Payer: Self-pay

## 2020-09-13 ENCOUNTER — Encounter: Payer: Self-pay | Admitting: Obstetrics and Gynecology

## 2020-09-13 ENCOUNTER — Ambulatory Visit (INDEPENDENT_AMBULATORY_CARE_PROVIDER_SITE_OTHER): Payer: Managed Care, Other (non HMO) | Admitting: Obstetrics and Gynecology

## 2020-09-13 VITALS — BP 110/70 | Wt 161.0 lb

## 2020-09-13 DIAGNOSIS — Z3482 Encounter for supervision of other normal pregnancy, second trimester: Secondary | ICD-10-CM

## 2020-09-13 DIAGNOSIS — Z3492 Encounter for supervision of normal pregnancy, unspecified, second trimester: Secondary | ICD-10-CM

## 2020-09-13 DIAGNOSIS — Z3A19 19 weeks gestation of pregnancy: Secondary | ICD-10-CM

## 2020-09-13 NOTE — Progress Notes (Signed)
Routine Prenatal Care Visit  Subjective  Alexandria Owen is a 30 y.o. G2P1001 at [redacted]w[redacted]d being seen today for ongoing prenatal care.  She is currently monitored for the following issues for this low-risk pregnancy and has Heart palpitations and Supervision of other normal pregnancy, antepartum on their problem list.  ----------------------------------------------------------------------------------- Patient reports no complaints.   Contractions: Not present. Vag. Bleeding: None.  Movement: Present. Leaking Fluid denies.  Anatomy u/s normal and complete today.  ----------------------------------------------------------------------------------- The following portions of the patient's history were reviewed and updated as appropriate: allergies, current medications, past family history, past medical history, past social history, past surgical history and problem list. Problem list updated.  Objective  Blood pressure 110/70, weight 161 lb (73 kg), last menstrual period 04/29/2020, currently breastfeeding. Pregravid weight 154 lb (69.9 kg) Total Weight Gain 7 lb (3.175 kg) Urinalysis: Urine Protein    Urine Glucose    Fetal Status: Fetal Heart Rate (bpm): 145   Movement: Present     General:  Alert, oriented and cooperative. Patient is in no acute distress.  Skin: Skin is warm and dry. No rash noted.   Cardiovascular: Normal heart rate noted  Respiratory: Normal respiratory effort, no problems with respiration noted  Abdomen: Soft, gravid, appropriate for gestational age. Pain/Pressure: Absent     Pelvic:  Cervical exam deferred        Extremities: Normal range of motion.     Mental Status: Normal mood and affect. Normal behavior. Normal judgment and thought content.   Imaging Results US OB Comp + 14 Wk  Result Date: 09/13/2020 Patient Name: [name] DOB: 04-14-1991 MRN: [id number] ULTRASOUND REPORT Location: [facility] Date of Service: 09/13/2020 Indications:Anatomy Ultrasound Findings:  [name] intrauterine pregnancy is visualized with FHR at 145 BPM. Biometrics give an (U/S) Gestational age of [redacted]w[redacted]d and an (U/S) EDD of 02/03/2021; this correlates with the clinically established Estimated Date of Delivery: 02/03/2021 Fetal presentation is Cephalic. EFW: 319 g (11 oz). Placenta: posterior. Grade: 1 AFI: subjectively normal. Anatomic survey is complete and normal; Gender - female.  Impression: 1. [redacted]w[redacted]d Viable Singleton Intrauterine pregnancy by U/S. 2. (U/S) EDD is consistent with Clinically established Estimated Date of Delivery: 02/03/21 . 3. Normal Anatomy Scan Gweneth Dimitri, RT There is a singleton gestation with subjectively normal amniotic fluid volume. The fetal biometry correlates with established dating. Detailed evaluation of the fetal anatomy was performed.The fetal anatomical survey appears within normal limits within the resolution of ultrasound as described above.  It must be noted that a normal ultrasound is unable to rule out fetal aneuploidy nor is it able to detect all possible malformations.   The ultrasound images and findings were reviewed by me and I agree with the above report. Prentice Docker, MD, [facility], [facility] 09/13/2020 9:50 AM       Assessment   30 y.o. G2P1001 at [redacted]w[redacted]d by  02/03/2021, by Last Menstrual Period presenting for routine prenatal visit  Plan   pregnancy2 Problems (from 04/29/20 to present)    Problem Noted Resolved   Supervision of other normal pregnancy, antepartum 06/24/2020 by Gae Dry, MD No   Overview Addendum 07/11/2020  2:26 PM by Gae Dry, MD    [facility]inic [facility] Prenatal Labs  Dating LMP after IUD removal, [place] confirmed Blood type: O/Positive/-- (11/12 1228)   Genetic Screen  NIPS: Antibody:Negative (11/12 1228)  Anatomic [place]  Rubella: 1.56 (11/12 1228) Varicella: Imm  GTT   Third trimester:  RPR: Non Reactive (  11/12 1228)   Rhogam n/a HBsAg: Negative (11/12 1228)   TDaP vaccine  03/2019     Flu Shot: HIV: Non Reactive (11/12 1228)   Baby Food   Breast                          GBS:   Contraception  Pap:06/24/20  CBB  no   CS/VBAC n/a   Support Person Gwyndolyn Saxon           Previous Version       Preterm labor symptoms and general obstetric precautions including but not limited to vaginal bleeding, contractions, leaking of fluid and fetal movement were reviewed in detail with the patient. Please refer to After Visit Summary for other counseling recommendations.   - would like to await natural labor up to 2 weeks past EDD. Discussed this is reasonable and discussed some benefits vs risks of this approach.   Return in about 4 weeks (around 10/11/2020) for Routine Prenatal Appointment.   Prentice Docker, MD, [facility], [facility] 09/13/2020 10:16 AM

## 2020-09-14 ENCOUNTER — Ambulatory Visit: Payer: Managed Care, Other (non HMO) | Admitting: Dermatology

## 2020-09-27 ENCOUNTER — Ambulatory Visit: Payer: Managed Care, Other (non HMO) | Admitting: Dermatology

## 2020-10-11 ENCOUNTER — Other Ambulatory Visit: Payer: Self-pay

## 2020-10-11 ENCOUNTER — Encounter: Payer: Self-pay | Admitting: Obstetrics and Gynecology

## 2020-10-11 ENCOUNTER — Ambulatory Visit (INDEPENDENT_AMBULATORY_CARE_PROVIDER_SITE_OTHER): Payer: Managed Care, Other (non HMO) | Admitting: Obstetrics and Gynecology

## 2020-10-11 VITALS — BP 118/74 | Wt 163.0 lb

## 2020-10-11 DIAGNOSIS — Z3A23 23 weeks gestation of pregnancy: Secondary | ICD-10-CM

## 2020-10-11 DIAGNOSIS — Z3482 Encounter for supervision of other normal pregnancy, second trimester: Secondary | ICD-10-CM

## 2020-10-11 DIAGNOSIS — Z131 Encounter for screening for diabetes mellitus: Secondary | ICD-10-CM

## 2020-10-11 DIAGNOSIS — Z113 Encounter for screening for infections with a predominantly sexual mode of transmission: Secondary | ICD-10-CM

## 2020-10-11 NOTE — Progress Notes (Signed)
°  Routine Prenatal Care Visit  Subjective  Alexandria Owen is a 30 y.o. G2P1001 at [redacted]w[redacted]d being seen today for ongoing prenatal care.  She is currently monitored for the following issues for this low-risk pregnancy and has Heart palpitations and Supervision of other normal pregnancy, antepartum on their problem list.  ----------------------------------------------------------------------------------- Patient reports no complaints.   Contractions: Not present. Vag. Bleeding: None.  Movement: Present. Leaking Fluid denies.  ----------------------------------------------------------------------------------- The following portions of the patient's history were reviewed and updated as appropriate: allergies, current medications, past family history, past medical history, past social history, past surgical history and problem list. Problem list updated.  Objective  Blood pressure 118/74, weight 163 lb (73.9 kg), last menstrual period 04/29/2020, currently breastfeeding. Pregravid weight 154 lb (69.9 kg) Total Weight Gain 9 lb (4.082 kg) Urinalysis: Urine Protein    Urine Glucose    Fetal Status: Fetal Heart Rate (bpm): 135   Movement: Present     General:  Alert, oriented and cooperative. Patient is in no acute distress.  Skin: Skin is warm and dry. No rash noted.   Cardiovascular: Normal heart rate noted  Respiratory: Normal respiratory effort, no problems with respiration noted  Abdomen: Soft, gravid, appropriate for gestational age. Pain/Pressure: Absent     Pelvic:  Cervical exam deferred        Extremities: Normal range of motion.     Mental Status: Normal mood and affect. Normal behavior. Normal judgment and thought content.   Assessment   30 y.o. G2P1001 at [redacted]w[redacted]d by  02/03/2021, by Last Menstrual Period presenting for routine prenatal visit  Plan   pregnancy2 Problems (from 04/29/20 to present)    Problem Noted Resolved   Supervision of other normal pregnancy, antepartum 06/24/2020 by  Gae Dry, MD No   Overview Addendum 07/11/2020  2:26 PM by Gae Dry, MD    [facility]inic [facility] Prenatal Labs  Dating LMP after IUD removal, [place] confirmed Blood type: O/Positive/-- (11/12 1228)   Genetic Screen  NIPS: Antibody:Negative (11/12 1228)  Anatomic [place]  Rubella: 1.56 (11/12 1228) Varicella: Imm  GTT   Third trimester:  RPR: Non Reactive (11/12 1228)   Rhogam n/a HBsAg: Negative (11/12 1228)   TDaP vaccine  03/2019    Flu Shot: HIV: Non Reactive (11/12 1228)   Baby Food   Breast                          GBS:   Contraception  Pap:06/24/20  CBB  no   CS/VBAC n/a   Support Person Gwyndolyn Saxon           Previous Version       Preterm labor symptoms and general obstetric precautions including but not limited to vaginal bleeding, contractions, leaking of fluid and fetal movement were reviewed in detail with the patient. Please refer to After Visit Summary for other counseling recommendations.   Return in about 4 weeks (around 11/08/2020) for 28 week labs, routine prenatal.   Prentice Docker, MD, [place], [facility] 10/11/2020 8:30 AM

## 2020-11-08 ENCOUNTER — Ambulatory Visit (INDEPENDENT_AMBULATORY_CARE_PROVIDER_SITE_OTHER): Payer: Managed Care, Other (non HMO) | Admitting: Obstetrics and Gynecology

## 2020-11-08 ENCOUNTER — Other Ambulatory Visit: Payer: Managed Care, Other (non HMO)

## 2020-11-08 ENCOUNTER — Other Ambulatory Visit: Payer: Self-pay

## 2020-11-08 ENCOUNTER — Encounter: Payer: Self-pay | Admitting: Obstetrics and Gynecology

## 2020-11-08 VITALS — BP 116/76 | Wt 170.0 lb

## 2020-11-08 DIAGNOSIS — Z3483 Encounter for supervision of other normal pregnancy, third trimester: Secondary | ICD-10-CM

## 2020-11-08 DIAGNOSIS — Z131 Encounter for screening for diabetes mellitus: Secondary | ICD-10-CM

## 2020-11-08 DIAGNOSIS — Z3A27 27 weeks gestation of pregnancy: Secondary | ICD-10-CM

## 2020-11-08 DIAGNOSIS — Z113 Encounter for screening for infections with a predominantly sexual mode of transmission: Secondary | ICD-10-CM

## 2020-11-08 NOTE — Progress Notes (Signed)
  Routine Prenatal Care Visit  Subjective  Alexandria Owen is a 30 y.o. G2P1001 at [redacted]w[redacted]d being seen today for ongoing prenatal care.  She is currently monitored for the following issues for this low-risk pregnancy and has Heart palpitations and Supervision of other normal pregnancy, antepartum on their problem list.  ----------------------------------------------------------------------------------- Patient reports no complaints.   Contractions: Not present. Vag. Bleeding: None.  Movement: Present. Leaking Fluid denies.  ----------------------------------------------------------------------------------- The following portions of the patient's history were reviewed and updated as appropriate: allergies, current medications, past family history, past medical history, past social history, past surgical history and problem list. Problem list updated.  Objective  Blood pressure 116/76, weight 170 lb (77.1 kg), last menstrual period 04/29/2020, currently breastfeeding. Pregravid weight 154 lb (69.9 kg) Total Weight Gain 16 lb (7.258 kg) Urinalysis: Urine Protein    Urine Glucose    Fetal Status: Fetal Heart Rate (bpm): 140 Fundal Height: 28 cm Movement: Present     General:  Alert, oriented and cooperative. Patient is in no acute distress.  Skin: Skin is warm and dry. No rash noted.   Cardiovascular: Normal heart rate noted  Respiratory: Normal respiratory effort, no problems with respiration noted  Abdomen: Soft, gravid, appropriate for gestational age. Pain/Pressure: Absent     Pelvic:  Cervical exam deferred        Extremities: Normal range of motion.  Edema: None  Mental Status: Normal mood and affect. Normal behavior. Normal judgment and thought content.   Assessment   30 y.o. G2P1001 at [redacted]w[redacted]d by  02/03/2021, by Last Menstrual Period presenting for routine prenatal visit  Plan   pregnancy2 Problems (from 04/29/20 to present)    Problem Noted Resolved   Supervision of other normal  pregnancy, antepartum 06/24/2020 by Gae Dry, MD No   Overview Addendum 07/11/2020  2:26 PM by Gae Dry, MD    [facility]inic [facility] Prenatal Labs  Dating LMP after IUD removal, [place] confirmed Blood type: O/Positive/-- (11/12 1228)   Genetic Screen  NIPS: Antibody:Negative (11/12 1228)  Anatomic [place]  Rubella: 1.56 (11/12 1228) Varicella: Imm  GTT   Third trimester:  RPR: Non Reactive (11/12 1228)   Rhogam n/a HBsAg: Negative (11/12 1228)   TDaP vaccine  03/2019    Flu Shot: HIV: Non Reactive (11/12 1228)   Baby Food   Breast                          GBS:   Contraception  Pap:06/24/20  CBB  no   CS/VBAC n/a   Support Person Gwyndolyn Saxon           Previous Version       Preterm labor symptoms and general obstetric precautions including but not limited to vaginal bleeding, contractions, leaking of fluid and fetal movement were reviewed in detail with the patient. Please refer to After Visit Summary for other counseling recommendations.   - 28 week labs today  Return in about 2 weeks (around 11/22/2020) for Routine Prenatal Appointment (may make multiple 2 week appts).   Prentice Docker, MD, [facility], [facility] 11/08/2020 10:46 AM

## 2020-11-09 LAB — 28 WEEK RH+PANEL
Basophils Absolute: 0 10*3/uL (ref 0.0–0.2)
Basos: 0 %
EOS (ABSOLUTE): 0 10*3/uL (ref 0.0–0.4)
Eos: 0 %
Gestational Diabetes Screen: 124 mg/dL (ref 65–139)
HIV Screen 4th Generation wRfx: NONREACTIVE
Hematocrit: 39.1 % (ref 34.0–46.6)
Hemoglobin: 13.2 g/dL (ref 11.1–15.9)
Immature Grans (Abs): 0.1 10*3/uL (ref 0.0–0.1)
Immature Granulocytes: 1 %
Lymphocytes Absolute: 1.3 10*3/uL (ref 0.7–3.1)
Lymphs: 15 %
MCH: 29.8 pg (ref 26.6–33.0)
MCHC: 33.8 g/dL (ref 31.5–35.7)
MCV: 88 fL (ref 79–97)
Monocytes Absolute: 0.5 10*3/uL (ref 0.1–0.9)
Monocytes: 6 %
Neutrophils Absolute: 6.6 10*3/uL (ref 1.4–7.0)
Neutrophils: 78 %
Platelets: 226 10*3/uL (ref 150–450)
RBC: 4.43 x10E6/uL (ref 3.77–5.28)
RDW: 12.6 % (ref 11.7–15.4)
RPR Ser Ql: NONREACTIVE
WBC: 8.6 10*3/uL (ref 3.4–10.8)

## 2020-11-22 ENCOUNTER — Ambulatory Visit (INDEPENDENT_AMBULATORY_CARE_PROVIDER_SITE_OTHER): Payer: Managed Care, Other (non HMO) | Admitting: Obstetrics and Gynecology

## 2020-11-22 ENCOUNTER — Encounter: Payer: Self-pay | Admitting: Obstetrics and Gynecology

## 2020-11-22 ENCOUNTER — Other Ambulatory Visit: Payer: Self-pay

## 2020-11-22 VITALS — BP 100/70 | Ht 68.0 in | Wt 168.0 lb

## 2020-11-22 DIAGNOSIS — Z3480 Encounter for supervision of other normal pregnancy, unspecified trimester: Secondary | ICD-10-CM

## 2020-11-22 DIAGNOSIS — Z23 Encounter for immunization: Secondary | ICD-10-CM

## 2020-11-22 DIAGNOSIS — Z3A29 29 weeks gestation of pregnancy: Secondary | ICD-10-CM

## 2020-11-22 DIAGNOSIS — Z7185 Encounter for immunization safety counseling: Secondary | ICD-10-CM

## 2020-11-22 LAB — POCT URINALYSIS DIPSTICK OB
Glucose, UA: NEGATIVE
POC,PROTEIN,UA: NEGATIVE

## 2020-11-22 NOTE — Progress Notes (Signed)
    Routine Prenatal Care Visit  Subjective  Alexandria Owen is a 30 y.o. G2P1001 at [redacted]w[redacted]d being seen today for ongoing prenatal care.  She is currently monitored for the following issues for this low-risk pregnancy and has Heart palpitations and Supervision of other normal pregnancy, antepartum on their problem list.  ----------------------------------------------------------------------------------- Patient reports increased pelvic pressure.   Contractions: Not present. Vag. Bleeding: None.  Movement: Present. Denies leaking of fluid.  ----------------------------------------------------------------------------------- The following portions of the patient's history were reviewed and updated as appropriate: allergies, current medications, past family history, past medical history, past social history, past surgical history and problem list. Problem list updated.   Objective  Blood pressure 100/70, height 5\' 8"  (1.727 m), weight 168 lb (76.2 kg), last menstrual period 04/29/2020, currently breastfeeding. Pregravid weight 154 lb (69.9 kg) Total Weight Gain 14 lb (6.35 kg) Urinalysis:      Fetal Status:     Movement: Present     General:  Alert, oriented and cooperative. Patient is in no acute distress.  Skin: Skin is warm and dry. No rash noted.   Cardiovascular: Normal heart rate noted  Respiratory: Normal respiratory effort, no problems with respiration noted  Abdomen: Soft, gravid, appropriate for gestational age. Pain/Pressure: Absent     Pelvic:  Cervical exam deferred        Extremities: Normal range of motion.     ental Status: Normal mood and affect. Normal behavior. Normal judgment and thought content.     Assessment   30 y.o. G2P1001 at [redacted]w[redacted]d by  02/03/2021, by Last Menstrual Period presenting for routine prenatal visit  Plan   pregnancy2 Problems (from 04/29/20 to present)    Problem Noted Resolved   Supervision of other normal pregnancy, antepartum 06/24/2020 by Gae Dry, MD No   Overview Addendum 11/22/2020  9:01 AM by Orlie Pollen, CNM     Nursing Staff Provider  Office Location  [facility] Dating   LMP  Language  English Anatomy US   complete, normal  Flu Vaccine   UTD Genetic Screen  NIPS:   Neg x 3, XX   TDaP vaccine   11/22/20 Hgb A1C or  GTT Early : n/a Third trimester : 128  Rhogam   n/a   LAB RESULTS   Feeding Plan  breast Blood Type O/Positive/-- (11/12 1228)   Contraception  Copper IUD Antibody Negative (11/12 1228)  Circumcision  n/a Rubella 1.56 (11/12 1228)  Pediatrician   RPR Non Reactive (03/29 1132)   Support Person  Gwyndolyn Saxon HBsAg Negative (11/12 1228)   Prenatal Classes   HIV Non Reactive (03/29 1132)    Varicella  immune  BTL Consent  n/a GBS  (For PCN allergy, check sensitivities)        VBAC Consent  n/a Pap  06/24/20 NILM    Hgb Electro   n/a   COVID  vax and boosted CF      SMA               Previous Version      -Tdap today -Discussed abdominal support for pelvic pressure - recommended maternity belt/belly band  Third trimester precautions including but not limited to vaginal bleeding, contractions, leaking of fluid and fetal movement were reviewed in detail with the patient.    Return in about 2 weeks (around 12/06/2020) for [place].  Orlie Pollen, CNM, MSN [facility], [facility] 11/22/2020, 9:01 AM

## 2020-12-06 ENCOUNTER — Ambulatory Visit (INDEPENDENT_AMBULATORY_CARE_PROVIDER_SITE_OTHER): Payer: Managed Care, Other (non HMO) | Admitting: Obstetrics and Gynecology

## 2020-12-06 ENCOUNTER — Encounter: Payer: Self-pay | Admitting: Obstetrics and Gynecology

## 2020-12-06 ENCOUNTER — Other Ambulatory Visit: Payer: Self-pay

## 2020-12-06 VITALS — BP 118/74 | Wt 174.0 lb

## 2020-12-06 DIAGNOSIS — Z3483 Encounter for supervision of other normal pregnancy, third trimester: Secondary | ICD-10-CM

## 2020-12-06 DIAGNOSIS — Z3A31 31 weeks gestation of pregnancy: Secondary | ICD-10-CM

## 2020-12-06 NOTE — Progress Notes (Signed)
  Routine Prenatal Care Visit  Subjective  Alexandria Owen is a 30 y.o. G2P1001 at [redacted]w[redacted]d being seen today for ongoing prenatal care.  She is currently monitored for the following issues for this low-risk pregnancy and has Heart palpitations and Supervision of other normal pregnancy, antepartum on their problem list.  ----------------------------------------------------------------------------------- Patient reports no complaints.   Contractions: Not present. Vag. Bleeding: None.  Movement: Present. Leaking Fluid denies.  ----------------------------------------------------------------------------------- The following portions of the patient's history were reviewed and updated as appropriate: allergies, current medications, past family history, past medical history, past social history, past surgical history and problem list. Problem list updated.  Objective  Blood pressure 118/74, weight 174 lb (78.9 kg), last menstrual period 04/29/2020, currently breastfeeding. Pregravid weight 154 lb (69.9 kg) Total Weight Gain 20 lb (9.072 kg) Urinalysis: Urine Protein    Urine Glucose    Fetal Status: Fetal Heart Rate (bpm): 130 Fundal Height: 31 cm Movement: Present     General:  Alert, oriented and cooperative. Patient is in no acute distress.  Skin: Skin is warm and dry. No rash noted.   Cardiovascular: Normal heart rate noted  Respiratory: Normal respiratory effort, no problems with respiration noted  Abdomen: Soft, gravid, appropriate for gestational age. Pain/Pressure: Absent     Pelvic:  Cervical exam deferred        Extremities: Normal range of motion.  Edema: None  Mental Status: Normal mood and affect. Normal behavior. Normal judgment and thought content.   Assessment   30 y.o. G2P1001 at [redacted]w[redacted]d by  02/03/2021, by Last Menstrual Period presenting for routine prenatal visit  Plan   pregnancy2 Problems (from 04/29/20 to present)    Problem Noted Resolved   Supervision of other normal  pregnancy, antepartum 06/24/2020 by Gae Dry, MD No   Overview Addendum 11/22/2020  9:01 AM by Orlie Pollen, CNM     Nursing Staff Provider  Office Location  [facility] Dating   LMP  Language  English Anatomy US   complete, normal  Flu Vaccine   UTD Genetic Screen  NIPS:   Neg x 3, XX   TDaP vaccine   11/22/20 Hgb A1C or  GTT Early : n/a Third trimester : 128  Rhogam   n/a   LAB RESULTS   Feeding Plan  breast Blood Type O/Positive/-- (11/12 1228)   Contraception  Copper IUD Antibody Negative (11/12 1228)  Circumcision  n/a Rubella 1.56 (11/12 1228)  Pediatrician   RPR Non Reactive (03/29 1132)   Support Person  Gwyndolyn Saxon HBsAg Negative (11/12 1228)   Prenatal Classes   HIV Non Reactive (03/29 1132)    Varicella  immune  BTL Consent  n/a GBS  (For PCN allergy, check sensitivities)        VBAC Consent  n/a Pap  06/24/20 NILM    Hgb Electro   n/a   COVID  vax and boosted CF      SMA               Previous Version       Preterm labor symptoms and general obstetric precautions including but not limited to vaginal bleeding, contractions, leaking of fluid and fetal movement were reviewed in detail with the patient. Please refer to After Visit Summary for other counseling recommendations.   Return in about 2 weeks (around 12/20/2020) for Routine Prenatal Appointment.   Prentice Docker, MD, [facility], [facility] 12/06/2020 9:09 AM

## 2020-12-20 ENCOUNTER — Ambulatory Visit (INDEPENDENT_AMBULATORY_CARE_PROVIDER_SITE_OTHER): Payer: Managed Care, Other (non HMO) | Admitting: Advanced Practice Midwife

## 2020-12-20 ENCOUNTER — Other Ambulatory Visit: Payer: Self-pay

## 2020-12-20 ENCOUNTER — Encounter: Payer: Self-pay | Admitting: Advanced Practice Midwife

## 2020-12-20 VITALS — BP 100/60 | Wt 171.0 lb

## 2020-12-20 DIAGNOSIS — Z3A33 33 weeks gestation of pregnancy: Secondary | ICD-10-CM

## 2020-12-20 DIAGNOSIS — Z3483 Encounter for supervision of other normal pregnancy, third trimester: Secondary | ICD-10-CM

## 2020-12-20 LAB — POCT URINALYSIS DIPSTICK OB
Glucose, UA: NEGATIVE
POC,PROTEIN,UA: NEGATIVE

## 2020-12-20 NOTE — Progress Notes (Signed)
  Routine Prenatal Care Visit  Subjective  Alexandria Owen is a 30 y.o. G2P1001 at [redacted]w[redacted]d being seen today for ongoing prenatal care.  She is currently monitored for the following issues for this low-risk pregnancy and has Heart palpitations and Supervision of other normal pregnancy, antepartum on their problem list.  ----------------------------------------------------------------------------------- Patient reports heartburn. Comfort measures reviewed.   Contractions: Not present. Vag. Bleeding: None.  Movement: Present. Leaking Fluid denies.  ----------------------------------------------------------------------------------- The following portions of the patient's history were reviewed and updated as appropriate: allergies, current medications, past family history, past medical history, past social history, past surgical history and problem list. Problem list updated.  Objective  Blood pressure 100/60, weight 171 lb (77.6 kg), last menstrual period 04/29/2020, currently breastfeeding. Pregravid weight 154 lb (69.9 kg) Total Weight Gain 17 lb (7.711 kg) Urinalysis: Urine Protein Negative  Urine Glucose Negative  Fetal Status: Fetal Heart Rate (bpm): 136 Fundal Height: 34 cm Movement: Present     General:  Alert, oriented and cooperative. Patient is in no acute distress.  Skin: Skin is warm and dry. No rash noted.   Cardiovascular: Normal heart rate noted  Respiratory: Normal respiratory effort, no problems with respiration noted  Abdomen: Soft, gravid, appropriate for gestational age. Pain/Pressure: Absent     Pelvic:  Cervical exam deferred        Extremities: Normal range of motion.  Edema: None  Mental Status: Normal mood and affect. Normal behavior. Normal judgment and thought content.   Assessment   30 y.o. G2P1001 at [redacted]w[redacted]d by  02/03/2021, by Last Menstrual Period presenting for routine prenatal visit  Plan   pregnancy2 Problems (from 04/29/20 to present)    Problem Noted Resolved    Supervision of other normal pregnancy, antepartum 06/24/2020 by Gae Dry, MD No   Overview Addendum 11/22/2020  9:01 AM by Orlie Pollen, CNM     Nursing Staff Provider  Office Location  [facility] Dating   LMP  Language  English Anatomy US   complete, normal  Flu Vaccine   UTD Genetic Screen  NIPS:   Neg x 3, XX   TDaP vaccine   11/22/20 Hgb A1C or  GTT Early : n/a Third trimester : 128  Rhogam   n/a   LAB RESULTS   Feeding Plan  breast Blood Type O/Positive/-- (11/12 1228)   Contraception  Copper IUD Antibody Negative (11/12 1228)  Circumcision  n/a Rubella 1.56 (11/12 1228)  Pediatrician   RPR Non Reactive (03/29 1132)   Support Person  Gwyndolyn Saxon HBsAg Negative (11/12 1228)   Prenatal Classes   HIV Non Reactive (03/29 1132)    Varicella  immune  BTL Consent  n/a GBS  (For PCN allergy, check sensitivities)        VBAC Consent  n/a Pap  06/24/20 NILM    Hgb Electro   n/a   COVID  vax and boosted CF      SMA               Previous Version       Preterm labor symptoms and general obstetric precautions including but not limited to vaginal bleeding, contractions, leaking of fluid and fetal movement were reviewed in detail with the patient.    Return in about 17 days (around 01/06/2021) for rob.  Rod Can, CNM 12/20/2020 9:03 AM

## 2021-01-06 ENCOUNTER — Encounter: Payer: Self-pay | Admitting: Advanced Practice Midwife

## 2021-01-06 ENCOUNTER — Other Ambulatory Visit (HOSPITAL_COMMUNITY)
Admission: RE | Admit: 2021-01-06 | Discharge: 2021-01-06 | Disposition: A | Discharge status: Home / Self Care | Payer: Managed Care, Other (non HMO) | Source: Ambulatory Visit | Attending: Advanced Practice Midwife | Admitting: Advanced Practice Midwife

## 2021-01-06 ENCOUNTER — Other Ambulatory Visit: Payer: Self-pay

## 2021-01-06 ENCOUNTER — Ambulatory Visit (INDEPENDENT_AMBULATORY_CARE_PROVIDER_SITE_OTHER): Payer: Managed Care, Other (non HMO) | Admitting: Advanced Practice Midwife

## 2021-01-06 VITALS — BP 100/66 | Wt 174.0 lb

## 2021-01-06 DIAGNOSIS — Z113 Encounter for screening for infections with a predominantly sexual mode of transmission: Secondary | ICD-10-CM | POA: Diagnosis present

## 2021-01-06 DIAGNOSIS — Z369 Encounter for antenatal screening, unspecified: Secondary | ICD-10-CM

## 2021-01-06 DIAGNOSIS — Z3A36 36 weeks gestation of pregnancy: Secondary | ICD-10-CM | POA: Insufficient documentation

## 2021-01-06 DIAGNOSIS — Z3483 Encounter for supervision of other normal pregnancy, third trimester: Secondary | ICD-10-CM | POA: Diagnosis present

## 2021-01-06 DIAGNOSIS — Z3685 Encounter for antenatal screening for Streptococcus B: Secondary | ICD-10-CM

## 2021-01-06 NOTE — Patient Instructions (Signed)
Pain Relief During Labor and Delivery Many things can cause pain during labor and delivery, including:  Pressure due to the baby moving through the pelvis.  Stretching of tissues due to the baby moving through the birth canal.  Muscle tension due to anxiety or nervousness.  The uterus tightening (contracting)and relaxing to help move the baby. How do I get pain relief during labor and delivery? Discuss your pain relief options with your health care provider during your prenatal visits. Explore the options offered by your hospital or birth center. There are many ways to deal with the pain of labor and delivery. You can try relaxation techniques or doing relaxing activities, taking a warm shower or bath (hydrotherapy), or other methods. There are also many medicines available to help control pain. Relaxation techniques and activities Practice relaxation techniques or do relaxing activities, such as:  Focused breathing.  Meditation.  Visualization.  Aroma therapy.  Listening to your favorite music.  Hypnosis. Hydrotherapy Take a warm shower or bath. This may:  Provide comfort and relaxation.  Lessen your feeling of pain.  Reduce the amount of pain medicine needed.  Shorten the length of labor. Other methods Try doing other things, such as:  Getting a massage or having counterpressure on your back.  Applying warm packs or ice packs.  Changing positions often, moving around, or using a birthing ball. Medicines You may be given:  Pain medicine through an IV or an injection into a muscle.  Pain medicine inserted into your spinal column.  Injections of sterile water just under the skin on your lower back.  Nitrous oxide inhalation therapy, also called laughing gas.   What kinds of medicine are available for pain relief? There are two kinds of medicines that can be used to relieve pain during labor and delivery:  Analgesics. These medicines decrease pain without  causing you to lose feeling or the ability to move your muscles.  Anesthetics. These medicines block feeling in the body and can decrease your ability to move freely. Both kinds of medicine can cause minor side effects, such as nausea, trouble concentrating, and sleepiness. They can also affect the baby's heart rate before birth and his or her breathing after birth. For this reason, health care providers are careful about when and how much medicine is given. Which medicines are used to provide pain relief? Common medicines The most common medicines used to help manage pain during labor and delivery include:  Opioids. Opioids are medicines that decrease how much pain you feel (perception of pain). These medicines can be given through an IV or may be used with anesthetics to block pain.  Epidural analgesia. ? Epidural analgesia is given through a very thin tube that is inserted into the lower back. Medicine is delivered continuously to the area near your spinal column nerves (epidural space). After having this treatment, you may be able to move your legs, but you will not be able to walk. Depending on the amount and type of medicine given, you may lose all feeling in the lower half of your body, or you may have some sensation, including the urge to push. This treatment can be used to give pain relief for a vaginal birth. ? Sometimes, a numbing medicine is injected into the spinal fluid when an epidural catheter is placed. This provides for immediate relief but only lasts for 1-2 hours. Once it wears off, the epidural will provide pain relief. This is called a combined spinal-epidural (CSE) block.  Intrathecal analgesia (  spinal analgesia). Intrathecal analgesia is similar to epidural analgesia, but the medicine is injected into the spinal fluid instead of the epidural space. It is usually only given once. It starts to relieve pain quickly, but the pain relief lasts only 1-2 hours.  Pudendal block. This  block is done by injecting numbing medicine through the wall of the vagina and into a nerve in the pelvis. Other medicines Other medicines used to help manage pain during labor and delivery include:  Local anesthetics. These are used to numb a small area of the body. They may be used along with another kind of medicine or used to numb the nerves of the vagina, cervix, and perineum during the second stage of labor.  Spinal block (spinal anesthesia). Spinal anesthesia is similar to spinal analgesia, but the medicine that is used contains longer-acting numbing medicines and pain medicines. This type of anesthesia can be used for a cesarean delivery and allows you to stay awake for the birth of your baby.  General anesthetics cause you to lose consciousness so you do not feel pain. They are usually only used for an emergency cesarean delivery. These medicines are given through an IV or a mask or both. These medicines are used as part of a procedure or for an emergency delivery. Summary  Women have many options to help them manage the pain associated with labor and delivery.  You can try doing relaxing activities, taking a warm shower or bath, or other methods.  There are also many medicines available to help control pain during labor and delivery.  Talk with your health care provider about what options are available to you. This information is not intended to replace advice given to you by your health care provider. Make sure you discuss any questions you have with your health care provider. Document Revised: 06/17/2019 Document Reviewed: 06/17/2019 Elsevier Patient Education  [place]. Vaginal Delivery  Vaginal delivery means that you give birth by pushing your baby out of your birth canal (vagina). A team of health care providers will help you before, during, and after vaginal delivery. Birth experiences are unique for every woman and every pregnancy, and birth experiences vary  depending on where you choose to give birth. What happens when I arrive at the birth center or hospital? Once you are in labor and have been admitted into the hospital or birth center, your health care provider may:  Review your pregnancy history and any concerns that you have.  Insert an IV into one of your veins. This may be used to give you fluids and medicines.  Check your blood pressure, pulse, temperature, and heart rate (vital signs).  Check whether your bag of water (amniotic sac) has broken (ruptured).  Talk with you about your birth plan and discuss pain control options. Monitoring Your health care provider may monitor your contractions (uterine monitoring) and your baby's heart rate (fetal monitoring). You may need to be monitored:  Often, but not continuously (intermittently).  All the time or for long periods at a time (continuously). Continuous monitoring may be needed if: ? You are taking certain medicines, such as medicine to relieve pain or make your contractions stronger. ? You have pregnancy or labor complications. Monitoring may be done by:  Placing a special stethoscope or a handheld monitoring device on your abdomen to check your baby's heartbeat and to check for contractions.  Placing monitors on your abdomen (external monitors) to record your baby's heartbeat and the frequency and length  of contractions.  Placing monitors inside your uterus through your vagina (internal monitors) to record your baby's heartbeat and the frequency, length, and strength of your contractions. Depending on the type of monitor, it may remain in your uterus or on your baby's head until birth.  Telemetry. This is a type of continuous monitoring that can be done with external or internal monitors. Instead of having to stay in bed, you are able to move around during telemetry. Physical exam Your health care provider may perform frequent physical exams. This may include:  Checking how  and where your baby is positioned in your uterus.  Checking your cervix to determine: ? Whether it is thinning out (effacing). ? Whether it is opening up (dilating). What happens during labor and delivery? Normal labor and delivery is divided into the following three stages: Stage 1  This is the longest stage of labor.  This stage can last for hours or days.  Throughout this stage, you will feel contractions. Contractions generally feel mild, infrequent, and irregular at first. They get stronger, more frequent (about every 2-3 minutes), and more regular as you move through this stage.  This stage ends when your cervix is completely dilated to 4 inches (10 cm) and completely effaced. Stage 2  This stage starts once your cervix is completely effaced and dilated and lasts until the delivery of your baby.  This stage may last from 20 minutes to 2 hours.  This is the stage where you will feel an urge to push your baby out of your vagina.  You may feel stretching and burning pain, especially when the widest part of your baby's head passes through the vaginal opening (crowning).  Once your baby is delivered, the umbilical cord will be clamped and cut. This usually occurs after waiting a period of 1-2 minutes after delivery.  Your baby will be placed on your bare chest (skin-to-skin contact) in an upright position and covered with a warm blanket. Watch your baby for feeding cues, like rooting or sucking, and help the baby to your breast for his or her first feeding. Stage 3  This stage starts immediately after the birth of your baby and ends after you deliver the placenta.  This stage may take anywhere from 5 to 30 minutes.  After your baby has been delivered, you will feel contractions as your body expels the placenta and your uterus contracts to control bleeding.   What can I expect after labor and delivery?  After labor is over, you and your baby will be monitored closely until you  are ready to go home to ensure that you are both healthy. Your health care team will teach you how to care for yourself and your baby.  You and your baby will stay in the same room (rooming in) during your hospital stay. This will encourage early bonding and successful breastfeeding.  You may continue to receive fluids and medicines through an IV.  Your uterus will be checked and massaged regularly (fundal massage).  You will have some soreness and pain in your abdomen, vagina, and the area of skin between your vaginal opening and your anus (perineum).  If an incision was made near your vagina (episiotomy) or if you had some vaginal tearing during delivery, cold compresses may be placed on your episiotomy or your tear. This helps to reduce pain and swelling.  You may be given a squirt bottle to use instead of wiping when you go to the bathroom. To use  the squirt bottle, follow these steps: ? Before you urinate, fill the squirt bottle with warm water. Do not use hot water. ? After you urinate, while you are sitting on the toilet, use the squirt bottle to rinse the area around your urethra and vaginal opening. This rinses away any urine and blood. ? Fill the squirt bottle with clean water every time you use the bathroom.  It is normal to have vaginal bleeding after delivery. Wear a sanitary pad for vaginal bleeding and discharge. Summary  Vaginal delivery means that you will give birth by pushing your baby out of your birth canal (vagina).  Your health care provider may monitor your contractions (uterine monitoring) and your baby's heart rate (fetal monitoring).  Your health care provider may perform a physical exam.  Normal labor and delivery is divided into three stages.  After labor is over, you and your baby will be monitored closely until you are ready to go home. This information is not intended to replace advice given to you by your health care provider. Make sure you discuss any  questions you have with your health care provider. Document Revised: 09/03/2017 Document Reviewed: 09/03/2017 Elsevier Patient Education  2021 [employer].

## 2021-01-06 NOTE — Progress Notes (Signed)
  Routine Prenatal Care Visit  Subjective  Alexandria Owen is a 30 y.o. G2P1001 at [redacted]w[redacted]d being seen today for ongoing prenatal care.  She is currently monitored for the following issues for this low-risk pregnancy and has Heart palpitations and Supervision of other normal pregnancy, antepartum on their problem list.  ----------------------------------------------------------------------------------- Patient reports no complaints.   Contractions: Not present. Vag. Bleeding: None.  Movement: Present. Leaking Fluid denies.  ----------------------------------------------------------------------------------- The following portions of the patient's history were reviewed and updated as appropriate: allergies, current medications, past family history, past medical history, past social history, past surgical history and problem list. Problem list updated.  Objective  Blood pressure 100/66, weight 174 lb (78.9 kg), last menstrual period 04/29/2020, currently breastfeeding. Pregravid weight 154 lb (69.9 kg) Total Weight Gain 20 lb (9.072 kg) Urinalysis: Urine Protein    Urine Glucose    Fetal Status: Fetal Heart Rate (bpm): 139 Fundal Height: 36 cm Movement: Present     General:  Alert, oriented and cooperative. Patient is in no acute distress.  Skin: Skin is warm and dry. No rash noted.   Cardiovascular: Normal heart rate noted  Respiratory: Normal respiratory effort, no problems with respiration noted  Abdomen: Soft, gravid, appropriate for gestational age. Pain/Pressure: Absent     Pelvic:  GBS/aptima collected        Extremities: Normal range of motion.  Edema: None  Mental Status: Normal mood and affect. Normal behavior. Normal judgment and thought content.   Assessment   30 y.o. G2P1001 at [redacted]w[redacted]d by  02/03/2021, by Last Menstrual Period presenting for routine prenatal visit  Plan   pregnancy2 Problems (from 04/29/20 to present)    Problem Noted Resolved   Supervision of other normal  pregnancy, antepartum 06/24/2020 by Gae Dry, MD No   Overview Addendum 11/22/2020  9:01 AM by Orlie Pollen, CNM     Nursing Staff Provider  Office Location  [facility] Dating   LMP  Language  English Anatomy US   complete, normal  Flu Vaccine   UTD Genetic Screen  NIPS:   Neg x 3, XX   TDaP vaccine   11/22/20 Hgb A1C or  GTT Early : n/a Third trimester : 128  Rhogam   n/a   LAB RESULTS   Feeding Plan  breast Blood Type O/Positive/-- (11/12 1228)   Contraception  Copper IUD Antibody Negative (11/12 1228)  Circumcision  n/a Rubella 1.56 (11/12 1228)  Pediatrician   RPR Non Reactive (03/29 1132)   Support Person  Gwyndolyn Saxon HBsAg Negative (11/12 1228)   Prenatal Classes   HIV Non Reactive (03/29 1132)    Varicella  immune  BTL Consent  n/a GBS  (For PCN allergy, check sensitivities)        VBAC Consent  n/a Pap  06/24/20 NILM    Hgb Electro   n/a   COVID  vax and boosted CF      SMA               Previous Version       Preterm labor symptoms and general obstetric precautions including but not limited to vaginal bleeding, contractions, leaking of fluid and fetal movement were reviewed in detail with the patient. Please refer to After Visit Summary for other counseling recommendations.   Return in about 1 week (around 01/13/2021) for rob.  Rod Can, CNM 01/06/2021 9:17 AM

## 2021-01-08 LAB — STREP GP B NAA: Strep Gp B NAA: NEGATIVE

## 2021-01-11 LAB — CERVICOVAGINAL ANCILLARY ONLY
Chlamydia: NEGATIVE
Comment: NEGATIVE
Comment: NEGATIVE
Comment: NORMAL
Neisseria Gonorrhea: NEGATIVE
Trichomonas: NEGATIVE

## 2021-01-13 ENCOUNTER — Ambulatory Visit (INDEPENDENT_AMBULATORY_CARE_PROVIDER_SITE_OTHER): Payer: Managed Care, Other (non HMO) | Admitting: Advanced Practice Midwife

## 2021-01-13 ENCOUNTER — Encounter: Payer: Self-pay | Admitting: Advanced Practice Midwife

## 2021-01-13 ENCOUNTER — Other Ambulatory Visit: Payer: Self-pay

## 2021-01-13 VITALS — BP 120/80 | Wt 177.0 lb

## 2021-01-13 DIAGNOSIS — Z3A37 37 weeks gestation of pregnancy: Secondary | ICD-10-CM

## 2021-01-13 DIAGNOSIS — Z3480 Encounter for supervision of other normal pregnancy, unspecified trimester: Secondary | ICD-10-CM

## 2021-01-13 NOTE — Progress Notes (Signed)
  Routine Prenatal Care Visit  Subjective  Alexandria Owen is a 30 y.o. G2P1001 at [redacted]w[redacted]d being seen today for ongoing prenatal care.  She is currently monitored for the following issues for this low-risk pregnancy and has Heart palpitations and Supervision of other normal pregnancy, antepartum on their problem list.  ----------------------------------------------------------------------------------- Patient reports no complaints.  Increased pelvic pressure especially at the end of the day. Contractions: Not present. Vag. Bleeding: None.  Movement: Present. Leaking Fluid denies.  ----------------------------------------------------------------------------------- The following portions of the patient's history were reviewed and updated as appropriate: allergies, current medications, past family history, past medical history, past social history, past surgical history and problem list. Problem list updated.  Objective  Blood pressure 120/80, weight 177 lb (80.3 kg), last menstrual period 04/29/2020, currently breastfeeding. Pregravid weight 154 lb (69.9 kg) Total Weight Gain 23 lb (10.4 kg) Urinalysis: Urine Protein    Urine Glucose    Fetal Status: Fetal Heart Rate (bpm): 127 Fundal Height: 37 cm Movement: Present     General:  Alert, oriented and cooperative. Patient is in no acute distress.  Skin: Skin is warm and dry. No rash noted.   Cardiovascular: Normal heart rate noted  Respiratory: Normal respiratory effort, no problems with respiration noted  Abdomen: Soft, gravid, appropriate for gestational age. Pain/Pressure: Absent     Pelvic:  Cervical exam deferred        Extremities: Normal range of motion.     Mental Status: Normal mood and affect. Normal behavior. Normal judgment and thought content.   Assessment   30 y.o. G2P1001 at [redacted]w[redacted]d by  02/03/2021, by Last Menstrual Period presenting for routine prenatal visit  Plan   pregnancy2 Problems (from 04/29/20 to present)    Problem Noted  Resolved   Supervision of other normal pregnancy, antepartum 06/24/2020 by Gae Dry, MD No   Overview Addendum 11/22/2020  9:01 AM by Orlie Pollen, CNM     Nursing Staff Provider  Office Location  [facility] Dating   LMP  Language  English Anatomy US   complete, normal  Flu Vaccine   UTD Genetic Screen  NIPS:   Neg x 3, XX   TDaP vaccine   11/22/20 Hgb A1C or  GTT Early : n/a Third trimester : 128  Rhogam   n/a   LAB RESULTS   Feeding Plan  breast Blood Type O/Positive/-- (11/12 1228)   Contraception  Copper IUD Antibody Negative (11/12 1228)  Circumcision  n/a Rubella 1.56 (11/12 1228)  Pediatrician   RPR Non Reactive (03/29 1132)   Support Person  Gwyndolyn Saxon HBsAg Negative (11/12 1228)   Prenatal Classes   HIV Non Reactive (03/29 1132)    Varicella  immune  BTL Consent  n/a GBS  (For PCN allergy, check sensitivities)        VBAC Consent  n/a Pap  06/24/20 NILM    Hgb Electro   n/a   COVID  vax and boosted CF      SMA               Previous Version       Term labor symptoms and general obstetric precautions including but not limited to vaginal bleeding, contractions, leaking of fluid and fetal movement were reviewed in detail with the patient.    Return in about 1 week (around 01/20/2021) for rob.  Rod Can, CNM 01/13/2021 8:34 AM

## 2021-01-19 ENCOUNTER — Ambulatory Visit (INDEPENDENT_AMBULATORY_CARE_PROVIDER_SITE_OTHER): Payer: Managed Care, Other (non HMO) | Admitting: Advanced Practice Midwife

## 2021-01-19 ENCOUNTER — Other Ambulatory Visit: Payer: Self-pay

## 2021-01-19 ENCOUNTER — Encounter: Payer: Self-pay | Admitting: Advanced Practice Midwife

## 2021-01-19 VITALS — BP 104/80 | Wt 176.0 lb

## 2021-01-19 DIAGNOSIS — Z3A37 37 weeks gestation of pregnancy: Secondary | ICD-10-CM

## 2021-01-19 DIAGNOSIS — Z3483 Encounter for supervision of other normal pregnancy, third trimester: Secondary | ICD-10-CM

## 2021-01-19 LAB — POCT URINALYSIS DIPSTICK OB
Glucose, UA: NEGATIVE
POC,PROTEIN,UA: NEGATIVE

## 2021-01-19 NOTE — Progress Notes (Signed)
ROB- cervix check 

## 2021-01-19 NOTE — Progress Notes (Signed)
  Routine Prenatal Care Visit  Subjective  Alexandria Owen is a 30 y.o. G2P1001 at [redacted]w[redacted]d being seen today for ongoing prenatal care.  She is currently monitored for the following issues for this low-risk pregnancy and has Heart palpitations and Supervision of other normal pregnancy, antepartum on their problem list.  ----------------------------------------------------------------------------------- Patient reports some upper thigh weakness/numb feeling in the past week.   Contractions: Not present. Vag. Bleeding: None.  Movement: Present. Leaking Fluid denies.  ----------------------------------------------------------------------------------- The following portions of the patient's history were reviewed and updated as appropriate: allergies, current medications, past family history, past medical history, past social history, past surgical history and problem list. Problem list updated.  Objective  Blood pressure 104/80, weight 176 lb (79.8 kg), last menstrual period 04/29/2020, currently breastfeeding. Pregravid weight 154 lb (69.9 kg) Total Weight Gain 22 lb (9.979 kg) Urinalysis: Urine Protein Negative  Urine Glucose Negative  Fetal Status: Fetal Heart Rate (bpm): 122 Fundal Height: 37 cm Movement: Present  Presentation: Vertex  General:  Alert, oriented and cooperative. Patient is in no acute distress.  Skin: Skin is warm and dry. No rash noted.   Cardiovascular: Normal heart rate noted  Respiratory: Normal respiratory effort, no problems with respiration noted  Abdomen: Soft, gravid, appropriate for gestational age. Pain/Pressure: Absent     Pelvic:  Cervical exam performed Dilation: 1 Effacement (%): 50 Station: -3  Extremities: Normal range of motion.  Edema: None  Mental Status: Normal mood and affect. Normal behavior. Normal judgment and thought content.   Assessment   30 y.o. G2P1001 at [redacted]w[redacted]d by  02/03/2021, by Last Menstrual Period presenting for routine prenatal visit  Plan    pregnancy2 Problems (from 04/29/20 to present)    Problem Noted Resolved   Supervision of other normal pregnancy, antepartum 06/24/2020 by Gae Dry, MD No   Overview Addendum 11/22/2020  9:01 AM by Orlie Pollen, CNM     Nursing Staff Provider  Office Location  [facility] Dating   LMP  Language  English Anatomy US   complete, normal  Flu Vaccine   UTD Genetic Screen  NIPS:   Neg x 3, XX   TDaP vaccine   11/22/20 Hgb A1C or  GTT Early : n/a Third trimester : 128  Rhogam   n/a   LAB RESULTS   Feeding Plan  breast Blood Type O/Positive/-- (11/12 1228)   Contraception  Copper IUD Antibody Negative (11/12 1228)  Circumcision  n/a Rubella 1.56 (11/12 1228)  Pediatrician   RPR Non Reactive (03/29 1132)   Support Person  Gwyndolyn Saxon HBsAg Negative (11/12 1228)   Prenatal Classes   HIV Non Reactive (03/29 1132)    Varicella  immune  BTL Consent  n/a GBS  (For PCN allergy, check sensitivities)        VBAC Consent  n/a Pap  06/24/20 NILM    Hgb Electro   n/a   COVID  vax and boosted CF      SMA                    Term labor symptoms and general obstetric precautions including but not limited to vaginal bleeding, contractions, leaking of fluid and fetal movement were reviewed in detail with the patient.   Return in about 1 week (around 01/26/2021) for rob.  Rod Can, CNM 01/19/2021 4:54 PM

## 2021-01-27 ENCOUNTER — Other Ambulatory Visit: Payer: Self-pay

## 2021-01-27 ENCOUNTER — Ambulatory Visit (INDEPENDENT_AMBULATORY_CARE_PROVIDER_SITE_OTHER): Payer: Managed Care, Other (non HMO) | Admitting: Obstetrics and Gynecology

## 2021-01-27 ENCOUNTER — Encounter: Payer: Self-pay | Admitting: Obstetrics and Gynecology

## 2021-01-27 VITALS — BP 110/72 | Wt 174.0 lb

## 2021-01-27 DIAGNOSIS — Z3483 Encounter for supervision of other normal pregnancy, third trimester: Secondary | ICD-10-CM

## 2021-01-27 DIAGNOSIS — Z3A39 39 weeks gestation of pregnancy: Secondary | ICD-10-CM

## 2021-01-27 NOTE — Progress Notes (Signed)
    Routine Prenatal Care Visit  Subjective  Alexandria Owen is a 30 y.o. G2P1001 at [redacted]w[redacted]d being seen today for ongoing prenatal care.  She is currently monitored for the following issues for this low-risk pregnancy and has Heart palpitations and Supervision of other normal pregnancy, antepartum on their problem list.  ----------------------------------------------------------------------------------- Patient reports no complaints.   Contractions: Not present. Vag. Bleeding: None.  Movement: Present. Denies leaking of fluid.  ----------------------------------------------------------------------------------- The following portions of the patient's history were reviewed and updated as appropriate: allergies, current medications, past family history, past medical history, past social history, past surgical history and problem list. Problem list updated.  Objective  Blood pressure 110/72, weight 174 lb (78.9 kg), last menstrual period 04/29/2020, currently breastfeeding. Pregravid weight 154 lb (69.9 kg) Total Weight Gain 20 lb (9.072 kg) Urinalysis:      Fetal Status: Fetal Heart Rate (bpm): 124 Fundal Height: 39 cm Movement: Present  Presentation: Vertex  General:  Alert, oriented and cooperative. Patient is in no acute distress.  Skin: Skin is warm and dry. No rash noted.   Cardiovascular: Normal heart rate noted  Respiratory: Normal respiratory effort, no problems with respiration noted  Abdomen: Soft, gravid, appropriate for gestational age. Pain/Pressure: Absent     Pelvic:  Cervical exam performed Dilation: 2 Effacement (%): 50 Station: -3  Extremities: Normal range of motion.  Edema: None  ental Status: Normal mood and affect. Normal behavior. Normal judgment and thought content.    Assessment   30 y.o. G2P1001 at [redacted]w[redacted]d by  02/03/2021, by Last Menstrual Period presenting for routine prenatal visit  Plan   pregnancy2 Problems (from 04/29/20 to present)     Problem Noted Resolved    Supervision of other normal pregnancy, antepartum 06/24/2020 by Gae Dry, MD No   Overview Addendum 11/22/2020  9:01 AM by Orlie Pollen, CNM     Nursing Staff Provider  Office Location  [facility] Dating   LMP  Language  English Anatomy US   complete, normal  Flu Vaccine   UTD Genetic Screen  NIPS:   Neg x 3, XX   TDaP vaccine   11/22/20 Hgb A1C or  GTT Early : n/a Third trimester : 128  Rhogam   n/a   LAB RESULTS   Feeding Plan  breast Blood Type O/Positive/-- (11/12 1228)   Contraception  Copper IUD Antibody Negative (11/12 1228)  Circumcision  n/a Rubella 1.56 (11/12 1228)  Pediatrician   RPR Non Reactive (03/29 1132)   Support Person  Gwyndolyn Saxon HBsAg Negative (11/12 1228)   Prenatal Classes   HIV Non Reactive (03/29 1132)    Varicella  immune  BTL Consent  n/a GBS  (For PCN allergy, check sensitivities)        VBAC Consent  n/a Pap  06/24/20 NILM    Hgb Electro   n/a   COVID  vax and boosted CF      SMA                   -Discussed IOL vs spontaneous onset of labor - patient declines IOL today - will consider schedule post-dates IOL at next visit -Membranes swept today  Term obstetric precautions including but not limited to vaginal bleeding, contractions, leaking of fluid and fetal movement were reviewed in detail with the patient.    Return in about 1 week (around 02/03/2021) for [place].  Orlie Pollen, CNM, MSN [facility], [facility] 01/27/2021, 9:03 AM

## 2021-02-03 ENCOUNTER — Ambulatory Visit (INDEPENDENT_AMBULATORY_CARE_PROVIDER_SITE_OTHER): Payer: Managed Care, Other (non HMO) | Admitting: Obstetrics and Gynecology

## 2021-02-03 ENCOUNTER — Encounter: Payer: Self-pay | Admitting: Obstetrics and Gynecology

## 2021-02-03 ENCOUNTER — Other Ambulatory Visit: Payer: Self-pay

## 2021-02-03 VITALS — BP 100/68 | Ht 68.0 in | Wt 178.0 lb

## 2021-02-03 DIAGNOSIS — Z3480 Encounter for supervision of other normal pregnancy, unspecified trimester: Secondary | ICD-10-CM

## 2021-02-03 DIAGNOSIS — Z3A40 40 weeks gestation of pregnancy: Secondary | ICD-10-CM

## 2021-02-03 LAB — POCT URINALYSIS DIPSTICK OB
Glucose, UA: NEGATIVE
POC,PROTEIN,UA: NEGATIVE

## 2021-02-03 NOTE — Progress Notes (Signed)
    Routine Prenatal Care Visit  Subjective  Alexandria Owen is a 30 y.o. G2P1001 at [redacted]w[redacted]d being seen today for ongoing prenatal care.  She is currently monitored for the following issues for this low-risk pregnancy and has Heart palpitations and Supervision of other normal pregnancy, antepartum on their problem list.  ----------------------------------------------------------------------------------- Patient reports no complaints.   Contractions: Not present. Vag. Bleeding: None.  Movement: Present. Denies leaking of fluid.  ----------------------------------------------------------------------------------- The following portions of the patient's history were reviewed and updated as appropriate: allergies, current medications, past family history, past medical history, past social history, past surgical history and problem list. Problem list updated.   Objective  Blood pressure 100/68, height 5\' 8"  (1.727 m), weight 178 lb (80.7 kg), last menstrual period 04/29/2020, currently breastfeeding. Pregravid weight 154 lb (69.9 kg) Total Weight Gain 24 lb (10.9 kg) Urinalysis:      Fetal Status: Fetal Heart Rate (bpm): 125 Fundal Height: 38 cm Movement: Present  Presentation: Vertex  General:  Alert, oriented and cooperative. Patient is in no acute distress.  Skin: Skin is warm and dry. No rash noted.   Cardiovascular: Normal heart rate noted  Respiratory: Normal respiratory effort, no problems with respiration noted  Abdomen: Soft, gravid, appropriate for gestational age. Pain/Pressure: Absent     Pelvic:  Cervical exam performed Dilation: 1.5 Effacement (%): 50 Station: -3  Extremities: Normal range of motion.  Edema: None  Mental Status: Normal mood and affect. Normal behavior. Normal judgment and thought content.     Assessment   30 y.o. G2P1001 at [redacted]w[redacted]d by  02/03/2021, by Last Menstrual Period presenting for routine prenatal visit  Plan   pregnancy2 Problems (from 04/29/20 to present)      Problem Noted Resolved   Supervision of other normal pregnancy, antepartum 06/24/2020 by Gae Dry, MD No   Overview Addendum 11/22/2020  9:01 AM by Orlie Pollen, CNM     Nursing Staff Provider  Office Location  [facility] Dating   LMP  Language  English Anatomy US   complete, normal  Flu Vaccine   UTD Genetic Screen  NIPS:   Neg x 3, XX   TDaP vaccine   11/22/20 Hgb A1C or  GTT Early : n/a Third trimester : 128  Rhogam   n/a   LAB RESULTS   Feeding Plan  breast Blood Type O/Positive/-- (11/12 1228)   Contraception  Copper IUD Antibody Negative (11/12 1228)  Circumcision  n/a Rubella 1.56 (11/12 1228)  Pediatrician   RPR Non Reactive (03/29 1132)   Support Person  Gwyndolyn Saxon HBsAg Negative (11/12 1228)   Prenatal Classes   HIV Non Reactive (03/29 1132)    Varicella  immune  BTL Consent  n/a GBS  (For PCN allergy, check sensitivities)        VBAC Consent  n/a Pap  06/24/20 NILM    Hgb Electro   n/a   COVID  vax and boosted CF      SMA                    Membranes swept at maternal request. IOL scheduled for 02/11/2021  Gestational age appropriate obstetric precautions including but not limited to vaginal bleeding, contractions, leaking of fluid and fetal movement were reviewed in detail with the patient.    Return in about 1 week (around 02/10/2021) for ROB in person.  Homero Fellers MD [facility] 02/03/2021, 12:03 PM

## 2021-02-03 NOTE — Patient Instructions (Addendum)
Covid Testing at 02/09/2021 between 8-10:30 am, Drive up testing in front of the [employer]. Follow signs for Pre-admission testing. Please wear a mask.  Induction 02/11/2021 at [place] through the [facility] for an 8 AM induction. Please enter through the ER for a midnight or 5 AM induction.  Please eat a meal prior to your arrival.    Labor Induction Labor induction is when steps are taken to cause a pregnant woman to begin the labor process. Most women go into labor on their own between 37 weeks and 42 weeks of pregnancy. When this does not happen, or when there is a medical needfor labor to begin, steps may be taken to induce, or bring on, labor. Labor induction causes a pregnant woman's uterus to contract. It also causes the cervix to soften (ripen), open (dilate), and thin out. Usually, labor is not induced before 39 weeks of pregnancyunless there is a medical reason to do so. When is labor induction considered? Labor induction may be right for you if: Your pregnancy lasts longer than 41 to 42 weeks. Your placenta is separating from your uterus (placental abruption). You have a rupture of membranes and your labor does not begin. You have health problems, like diabetes or high blood pressure (preeclampsia) during your pregnancy. Your baby has stopped growing or does not have enough amniotic fluid. Before labor induction begins, your health care provider will consider the following factors: Your medical condition and the baby's condition. How many weeks you have been pregnant. How mature the baby's lungs are. The condition of your cervix. The position of the baby. The size of your birth canal. Tell a health care provider about: Any allergies you have. All medicines you are taking, including vitamins, herbs, eye drops, creams, and over-the-counter medicines. Any problems you or your family members have had with anesthetic medicines. Any surgeries you have had. Any blood  disorders you have. Any medical conditions you have. What are the risks? Generally, this is a safe procedure. However, problems may occur, including: Failed induction. Changes in fetal heart rate, such as being too high, too low, or irregular (erratic). Infection in the mother or the baby. Increased risk of having a cesarean delivery. Breaking off (abruption) of the placenta from the uterus. This is rare. Rupture of the uterus. This is very rare. Your baby could fail to get enough blood flow or oxygen. This can be life-threatening. When induction is needed for medical reasons, the benefits generally outweighthe risks. What happens during the procedure? During the procedure, your health care provider will use one of these methods to induce labor: Stripping the membranes. In this method, the amniotic sac tissue is gently separated from the cervix. This causes the following to happen: Your cervix stretches, which in turn causes the release of prostaglandins. Prostaglandins induce labor and cause the uterus to contract. This procedure is often done in an office visit. You will be sent home to wait for contractions to begin. Prostaglandin medicine. This medicine starts contractions and causes the cervix to dilate and ripen. This can be taken by mouth (orally) or by being inserted into the vagina (suppository). Inserting a small, thin tube (catheter) with a balloon into the vagina and then expanding the balloon with water to dilate the cervix. Breaking the water. In this method, a small instrument is used to make a small hole in the amniotic sac. This eventually causes the amniotic sac to break. Contractions should begin within a few hours. Medicine  to trigger or strengthen contractions. This medicine is given through an IV that is inserted into a vein in your arm. This procedure may vary among health care providers and hospitals. Where to find more information [employer]:  www.marchofdimes.org [employer]ians [employer]necologists: www.acog.org Summary Labor induction causes a pregnant woman's uterus to contract. It also causes the cervix to soften (ripen), open (dilate), and thin out. Labor is usually not induced before 39 weeks of pregnancy unless there is a medical reason to do so. When induction is needed for medical reasons, the benefits generally outweigh the risks. Talk with your health care provider about which methods of labor induction are right for you. This information is not intended to replace advice given to you by your health care provider. Make sure you discuss any questions you have with your healthcare provider. Document Revised: 05/12/2020 Document Reviewed: 05/12/2020 Elsevier Patient Education  [place]. Pain Relief During Labor and Delivery Many things can cause pain during labor and delivery, including: Pressure due to the baby moving through the pelvis. Stretching of tissues due to the baby moving through the birth canal. Muscle tension due to anxiety or nervousness. The uterus tightening (contracting)and relaxing to help move the baby. How do I get pain relief during labor and delivery?  Discuss your pain relief options with your health care provider during your prenatal visits. Explore the options offered by your hospital or birth center. There are many ways to deal with the pain of labor and delivery. You can try relaxation techniques or doing relaxing activities, taking a warm shower or bath (hydrotherapy), or other methods. There are also many medicines available to help controlpain. Relaxation techniques and activities Practice relaxation techniques or do relaxing activities, such as: Focused breathing. Meditation. Visualization. Aroma therapy. Listening to your favorite music. Hypnosis. Hydrotherapy Take a warm shower or bath. This may: Provide comfort and relaxation. Lessen your feeling of  pain. Reduce the amount of pain medicine needed. Shorten the length of labor. Other methods Try doing other things, such as: Getting a massage or having counterpressure on your back. Applying warm packs or ice packs. Changing positions often, moving around, or using a birthing ball. Medicines You may be given: Pain medicine through an IV or an injection into a muscle. Pain medicine inserted into your spinal column. Injections of sterile water just under the skin on your lower back. Nitrous oxide inhalation therapy, also called laughing gas. What kinds of medicine are available for pain relief? There are two kinds of medicines that can be used to relieve pain during labor and delivery: Analgesics. These medicines decrease pain without causing you to lose feeling or the ability to move your muscles. Anesthetics. These medicines block feeling in the body and can decrease your ability to move freely. Both kinds of medicine can cause minor side effects, such as nausea, trouble concentrating, and sleepiness. They can also affect the baby's heart rate before birth and his or her breathing after birth. For this reason, health careproviders are careful about when and how much medicine is given. Which medicines are used to provide pain relief? Common medicines The most common medicines used to help manage pain during labor and delivery include: Opioids. Opioids are medicines that decrease how much pain you feel (perception of pain). These medicines can be given through an IV or may be used with anesthetics to block pain. Epidural analgesia. Epidural analgesia is given through a very thin tube that is inserted  into the lower back. Medicine is delivered continuously to the area near your spinal column nerves (epidural space). After having this treatment, you may be able to move your legs, but you will not be able to walk. Depending on the amount and type of medicine given, you may lose all feeling in the  lower half of your body, or you may have some sensation, including the urge to push. This treatment can be used to give pain relief for a vaginal birth. Sometimes, a numbing medicine is injected into the spinal fluid when an epidural catheter is placed. This provides for immediate relief but only lasts for 1-2 hours. Once it wears off, the epidural will provide pain relief. This is called a combined spinal-epidural (CSE) block. Intrathecal analgesia (spinal analgesia). Intrathecal analgesia is similar to epidural analgesia, but the medicine is injected into the spinal fluid instead of the epidural space. It is usually only given once. It starts to relieve pain quickly, but the pain relief lasts only 1-2 hours. Pudendal block. This block is done by injecting numbing medicine through the wall of the vagina and into a nerve in the pelvis. Other medicines Other medicines used to help manage pain during labor and delivery include: Local anesthetics. These are used to numb a small area of the body. They may be used along with another kind of medicine or used to numb the nerves of the vagina, cervix, and perineum during the second stage of labor. Spinal block (spinal anesthesia). Spinal anesthesia is similar to spinal analgesia, but the medicine that is used contains longer-acting numbing medicines and pain medicines. This type of anesthesia can be used for a cesarean delivery and allows you to stay awake for the birth of your baby. General anesthetics cause you to lose consciousness so you do not feel pain. They are usually only used for an emergency cesarean delivery. These medicines are given through an IV or a mask or both. These medicines are used as part of a procedure or for an emergency delivery. Summary Women have many options to help them manage the pain associated with labor and delivery. You can try doing relaxing activities, taking a warm shower or bath, or other methods. There are also many  medicines available to help control pain during labor and delivery. Talk with your health care provider about what options are available to you. This information is not intended to replace advice given to you by your health care provider. Make sure you discuss any questions you have with your healthcare provider. Document Revised: 06/17/2019 Document Reviewed: 06/17/2019 Elsevier Patient Education  [place]. Vaginal Delivery  Vaginal delivery means that you give birth by pushing your baby out of your birth canal (vagina). Your health care team will help you before, during, and after vaginaldelivery. Birth experiences are unique for every woman and every pregnancy, and birthexperiences vary depending on where you choose to give birth. What are the risks and benefits? Generally, this is safe. However, problems may occur, including: Bleeding. Infection. Damage to other structures such as vaginal tearing. Allergic reactions to medicines. Despite the risks, benefits of vaginal delivery include less risk of bleeding and infection and a shorter recovery time compared to a Cesarean delivery.Cesarean delivery, or C-section, is the surgical delivery of a baby. What happens when I arrive at the birth center or hospital? Once you are in labor and have been admitted into the hospital or birth center, your health care team may: Review your pregnancy history and  any concerns that you have. Talk with you about your birth plan and discuss pain control options. Check your blood pressure, breathing, and heartbeat. Assess your baby's heartbeat. Monitor your uterus for contractions. Check whether your bag of water (amniotic sac) has broken (ruptured). Insert an IV into one of your veins. This may be used to give you fluids and medicines. Monitoring Your health care team may assess your contractions (uterine monitoring) and your baby's heart rate (fetal monitoring). You may need to be  monitored: Often, but not continuously (intermittently). All the time or for long periods at a time (continuously). Continuous monitoring may be needed if: You are taking certain medicines, such as medicine to relieve pain or make your contractions stronger. You have pregnancy or labor complications. Monitoring may be done by: Placing a special stethoscope or a handheld monitoring device on your abdomen to check your baby's heartbeat and to check for contractions. Placing monitors on your abdomen (external monitors) to record your baby's heartbeat and the frequency and length of contractions. Placing monitors inside your uterus through your vagina (internal monitors) to record your baby's heartbeat and the frequency, length, and strength of your contractions. Depending on the type of monitor, it may remain in your uterus or on your baby's head until birth. Telemetry. This is a type of continuous monitoring that can be done with external or internal monitors. Instead of having to stay in bed, you are able to move around. Physical exam Your health care team may perform frequent physical exams. This may include: Checking how and where your baby is positioned in your uterus. Checking your cervix to determine: Whether it is thinning out (effacing). Whether it is opening up (dilating). What happens during labor and delivery?  Normal labor and delivery is divided into the following three stages: Stage 1 This is the longest stage of labor. Throughout this stage, you will feel contractions. Contractions generally feel mild, infrequent, and irregular at first. They get stronger, more frequent, and more regular as you move through this stage. You may have contractions about every 2-3 minutes. This stage ends when your cervix is completely dilated to 4 inches (10 cm) and completely effaced. Stage 2 This stage starts once your cervix is completely effaced and dilated and lasts until the delivery of your  baby. This is the stage where you will feel an urge to push your baby out of your vagina. You may feel stretching and burning pain, especially when the widest part of your baby's head passes through the vaginal opening (crowning). Once your baby is delivered, the umbilical cord will be clamped and cut. Timing of cutting the cord will depend on your wishes, your baby's health, and your health care provider's practices. Your baby will be placed on your bare chest (skin-to-skin contact) in an upright position and covered with a warm blanket. If you are choosing to breastfeed, watch your baby for feeding cues, like rooting or sucking, and help the baby to your breast for his or her first feeding. Stage 3 This stage starts immediately after the birth of your baby and ends after you deliver the placenta. This stage may take anywhere from 5 to 30 minutes. After your baby has been delivered, you will feel contractions as your body expels the placenta. These contractions also help your uterus get smaller and reduce bleeding. What can I expect after labor and delivery? After labor is over, you and your baby will be assessed closely until you are ready to go  home. Your health care team will teach you how to care for yourself and your baby. You and your baby may be encouraged to stay in the same room (rooming in) during your hospital stay. This will help promote early bonding and successful breastfeeding. Your uterus will be checked and massaged regularly (fundal massage). You may continue to receive fluids and medicines through an IV. You will have some soreness and pain in your abdomen, vagina, and the area of skin between your vaginal opening and your anus (perineum). If an incision was made near your vagina (episiotomy) or if you had some vaginal tearing during delivery, cold compresses may be placed on your episiotomy or your tear. This helps to reduce pain and swelling. It is normal to have vaginal  bleeding after delivery. Wear a sanitary pad for vaginal bleeding and discharge. Summary Vaginal delivery means that you will give birth by pushing your baby out of your birth canal (vagina). Your health care team will monitor you and your baby throughout the stages of labor. After you deliver your baby, your health care team will continue to assess you and your baby to ensure you are both recovering as expected after delivery. This information is not intended to replace advice given to you by your health care provider. Make sure you discuss any questions you have with your healthcare provider. Document Revised: 06/27/2020 Document Reviewed: 06/27/2020 Elsevier Patient Education  2022 [employer].

## 2021-02-03 NOTE — Addendum Note (Signed)
Addended by: Vernia Buff A on: 02/03/2021 12:09 PM   Modules accepted: Orders

## 2021-02-04 ENCOUNTER — Inpatient Hospital Stay: Payer: Managed Care, Other (non HMO) | Admitting: Anesthesiology

## 2021-02-04 ENCOUNTER — Encounter: Payer: Self-pay | Admitting: Obstetrics and Gynecology

## 2021-02-04 ENCOUNTER — Inpatient Hospital Stay
Admission: EM | Admit: 2021-02-04 | Discharge: 2021-02-06 | DRG: 806 | Disposition: A | Discharge status: Home / Self Care | Payer: Managed Care, Other (non HMO) | Attending: Obstetrics and Gynecology | Admitting: Obstetrics and Gynecology

## 2021-02-04 ENCOUNTER — Other Ambulatory Visit: Payer: Self-pay

## 2021-02-04 DIAGNOSIS — Z3490 Encounter for supervision of normal pregnancy, unspecified, unspecified trimester: Secondary | ICD-10-CM | POA: Diagnosis present

## 2021-02-04 DIAGNOSIS — O480 Post-term pregnancy: Secondary | ICD-10-CM | POA: Diagnosis not present

## 2021-02-04 DIAGNOSIS — Z3A40 40 weeks gestation of pregnancy: Secondary | ICD-10-CM | POA: Diagnosis not present

## 2021-02-04 DIAGNOSIS — D62 Acute posthemorrhagic anemia: Secondary | ICD-10-CM | POA: Diagnosis not present

## 2021-02-04 DIAGNOSIS — Z20822 Contact with and (suspected) exposure to covid-19: Secondary | ICD-10-CM | POA: Diagnosis present

## 2021-02-04 DIAGNOSIS — O9081 Anemia of the puerperium: Secondary | ICD-10-CM | POA: Diagnosis not present

## 2021-02-04 DIAGNOSIS — O26893 Other specified pregnancy related conditions, third trimester: Secondary | ICD-10-CM | POA: Diagnosis present

## 2021-02-04 DIAGNOSIS — Z3480 Encounter for supervision of other normal pregnancy, unspecified trimester: Secondary | ICD-10-CM

## 2021-02-04 LAB — CBC
HCT: 33 % — ABNORMAL LOW (ref 36.0–46.0)
Hemoglobin: 11.7 g/dL — ABNORMAL LOW (ref 12.0–15.0)
MCH: 30.5 pg (ref 26.0–34.0)
MCHC: 35.5 g/dL (ref 30.0–36.0)
MCV: 85.9 fL (ref 80.0–100.0)
Platelets: 181 10*3/uL (ref 150–400)
RBC: 3.84 MIL/uL — ABNORMAL LOW (ref 3.87–5.11)
RDW: 12.6 % (ref 11.5–15.5)
WBC: 16.5 10*3/uL — ABNORMAL HIGH (ref 4.0–10.5)
nRBC: 0 % (ref 0.0–0.2)

## 2021-02-04 LAB — TYPE AND SCREEN
ABO/RH(D): O POS
Antibody Screen: NEGATIVE

## 2021-02-04 LAB — RESP PANEL BY RT-PCR (FLU A&B, COVID) ARPGX2
Influenza A by PCR: NEGATIVE
Influenza B by PCR: NEGATIVE
SARS Coronavirus 2 by RT PCR: NEGATIVE

## 2021-02-04 MED ORDER — TERBUTALINE SULFATE 1 MG/ML IJ SOLN: 0.2500 mg | Freq: Once | INTRAMUSCULAR | Status: DC | PRN

## 2021-02-04 MED ORDER — LIDOCAINE HCL (PF) 1 % IJ SOLN
INTRAMUSCULAR | Status: AC
  Filled 2021-02-04: qty 30

## 2021-02-04 MED ORDER — SOD CITRATE-CITRIC ACID 500-334 MG/5ML PO SOLN: 30.0000 mL | ORAL | Status: DC | PRN

## 2021-02-04 MED ORDER — OXYTOCIN BOLUS FROM INFUSION
333.0000 mL | Freq: Once | INTRAVENOUS | Status: AC
  Administered 2021-02-05: 333 mL via INTRAVENOUS

## 2021-02-04 MED ORDER — OXYTOCIN-SODIUM CHLORIDE 30-0.9 UT/500ML-% IV SOLN
2.5000 [IU]/h | INTRAVENOUS | Status: DC
  Administered 2021-02-05: 2.5 [IU]/h via INTRAVENOUS
  Filled 2021-02-04: qty 500

## 2021-02-04 MED ORDER — OXYTOCIN 10 UNIT/ML IJ SOLN
INTRAMUSCULAR | Status: AC
  Filled 2021-02-04: qty 2

## 2021-02-04 MED ORDER — MISOPROSTOL 200 MCG PO TABS
ORAL_TABLET | ORAL | Status: AC
  Filled 2021-02-04: qty 4

## 2021-02-04 MED ORDER — FENTANYL 2.5 MCG/ML W/ROPIVACAINE 0.15% IN NS 100 ML EPIDURAL (ARMC)
EPIDURAL | Status: AC
  Filled 2021-02-04: qty 100

## 2021-02-04 MED ORDER — ACETAMINOPHEN 325 MG PO TABS: 650.0000 mg | ORAL_TABLET | ORAL | Status: DC | PRN

## 2021-02-04 MED ORDER — BUTORPHANOL TARTRATE 2 MG/ML IJ SOLN: 2.0000 mg | INTRAMUSCULAR | Status: DC | PRN

## 2021-02-04 MED ORDER — OXYTOCIN-SODIUM CHLORIDE 30-0.9 UT/500ML-% IV SOLN
INTRAVENOUS | Status: AC
  Filled 2021-02-04: qty 500

## 2021-02-04 MED ORDER — OXYTOCIN-SODIUM CHLORIDE 30-0.9 UT/500ML-% IV SOLN: 1.0000 m[IU]/min | INTRAVENOUS | Status: DC

## 2021-02-04 MED ORDER — FENTANYL 2.5 MCG/ML W/ROPIVACAINE 0.15% IN NS 100 ML EPIDURAL (ARMC)
EPIDURAL | Status: DC | PRN
  Administered 2021-02-04: 12 mL/h via EPIDURAL

## 2021-02-04 MED ORDER — BUPIVACAINE HCL (PF) 0.25 % IJ SOLN
INTRAMUSCULAR | Status: DC | PRN
  Administered 2021-02-04: 8 mL via EPIDURAL

## 2021-02-04 MED ORDER — LACTATED RINGERS IV SOLN
500.0000 mL | INTRAVENOUS | Status: DC | PRN
  Administered 2021-02-04: 500 mL via INTRAVENOUS

## 2021-02-04 MED ORDER — LIDOCAINE HCL (PF) 1 % IJ SOLN: 30.0000 mL | INTRAMUSCULAR | Status: DC | PRN

## 2021-02-04 MED ORDER — LIDOCAINE HCL (PF) 1 % IJ SOLN
INTRAMUSCULAR | Status: DC | PRN
  Administered 2021-02-04: 3 mL

## 2021-02-04 MED ORDER — ONDANSETRON HCL 4 MG/2ML IJ SOLN: 4.0000 mg | Freq: Four times a day (QID) | INTRAMUSCULAR | Status: DC | PRN

## 2021-02-04 MED ORDER — LIDOCAINE-EPINEPHRINE (PF) 1.5 %-1:200000 IJ SOLN
INTRAMUSCULAR | Status: DC | PRN
  Administered 2021-02-04: 3 mL via PERINEURAL

## 2021-02-04 MED ORDER — MISOPROSTOL 25 MCG QUARTER TABLET
25.0000 ug | ORAL_TABLET | ORAL | Status: DC | PRN
  Filled 2021-02-04: qty 1

## 2021-02-04 MED ORDER — LACTATED RINGERS IV SOLN: INTRAVENOUS | Status: DC

## 2021-02-04 MED ORDER — AMMONIA AROMATIC IN INHA
RESPIRATORY_TRACT | Status: AC
  Filled 2021-02-04: qty 10

## 2021-02-04 NOTE — OB Triage Note (Signed)
Pt presents to unit c/o ctx that have gradually gotten closer together. +FM, denies LOF and vaginal bleeding.

## 2021-02-04 NOTE — H&P (Addendum)
OB History & Physical   History of Present Illness:  Chief Complaint: contractions since last night that have increased in intensity and frequency  HPI:  Alexandria Owen is a 30 y.o. G2P1001 female at [redacted]w[redacted]d dated by LMP c/w 10 week u/s.  Her pregnancy has been uncomplicated.    She reports contractions.   She denies leakage of fluid.   She reports vaginal spotting after sweep yesterday.   She reports fetal movement.    Total weight gain for pregnancy: 10.9 kg   Obstetrical Problem List: pregnancy2 Problems (from 04/29/20 to present)     Problem Noted Resolved   Supervision of other normal pregnancy, antepartum 06/24/2020 by Gae Dry, MD No   Overview Addendum 11/22/2020  9:01 AM by Orlie Pollen, CNM     Nursing Staff Provider  Office Location  [facility] Dating   LMP  Language  English Anatomy US   complete, normal  Flu Vaccine   UTD Genetic Screen  NIPS:   Neg x 3, XX   TDaP vaccine   11/22/20 Hgb A1C or  GTT Early : n/a Third trimester : 128  Rhogam   n/a   LAB RESULTS   Feeding Plan  breast Blood Type O/Positive/-- (11/12 1228)   Contraception  Copper IUD Antibody Negative (11/12 1228)  Circumcision  n/a Rubella 1.56 (11/12 1228)  Pediatrician   RPR Non Reactive (03/29 1132)   Support Person  Gwyndolyn Saxon HBsAg Negative (11/12 1228)   Prenatal Classes   HIV Non Reactive (03/29 1132)    Varicella  immune  BTL Consent  n/a GBS  (For PCN allergy, check sensitivities)        VBAC Consent  n/a Pap  06/24/20 NILM    Hgb Electro   n/a   COVID  vax and boosted CF      SMA                    Maternal Medical History:   Past Medical History:  Diagnosis Date   Allergic rhinitis    Vaccine for human papilloma virus (HPV) types 6, 11, 16, and 18 administered     No past surgical history on file.  No Known Allergies  Prior to Admission medications   Medication Sig Start Date End Date Taking? Authorizing Provider  Prenatal Vit-Fe Fumarate-FA (MULTIVITAMIN-PRENATAL)  27-0.8 MG TABS tablet Take 1 tablet by mouth daily at 12 noon.    [provider]    OB History  Gravida Para Term Preterm AB Living  2 1 1  0 0 1  SAB IAB Ectopic Multiple Live Births  0 0 0 0 1    # Outcome Date GA Lbr Len/2nd Weight Sex Delivery Anes PTL Lv  2 Current           1 Term 06/05/19 [redacted]w[redacted]d / 02:09 3680 g M Vag-Spont EPI  LIV    Prenatal care site: [facility]  Social History: She  reports that she has never smoked. She has never used smokeless tobacco. She reports previous alcohol use. She reports that she does not use drugs.  Family History: family history is not on file.    Review of Systems:  Review of Systems  Constitutional:  Negative for chills and fever.  HENT:  Negative for congestion, ear discharge, ear pain, hearing loss, sinus pain and sore throat.   Eyes:  Negative for blurred vision and double vision.  Respiratory:  Negative for cough, shortness of breath and wheezing.  Cardiovascular:  Negative for chest pain, palpitations and leg swelling.  Gastrointestinal:  Positive for abdominal pain. Negative for blood in stool, constipation, diarrhea, heartburn, melena, nausea and vomiting.  Genitourinary:  Negative for dysuria, flank pain, frequency, hematuria and urgency.  Musculoskeletal:  Positive for back pain. Negative for joint pain and myalgias.  Skin:  Negative for itching and rash.  Neurological:  Negative for dizziness, tingling, tremors, sensory change, speech change, focal weakness, seizures, loss of consciousness, weakness and headaches.  Endo/Heme/Allergies:  Negative for environmental allergies. Does not bruise/bleed easily.  Psychiatric/Behavioral:  Negative for depression, hallucinations, memory loss, substance abuse and suicidal ideas. The patient is not nervous/anxious and does not have insomnia.     Physical Exam:  BP 121/81   Pulse 83   Ht 5\' 8"  (1.727 m)   Wt 80.7 kg   LMP 04/29/2020   BMI 27.06 kg/m   Constitutional:  Well nourished, well developed female in no acute distress.  HEENT: normal Skin: Warm and dry.  Cardiovascular: Regular rate and rhythm.   Extremity:  no edema   Respiratory: Clear to auscultation bilateral. Normal respiratory effort Abdomen: FHT present Back: no CVAT Neuro: DTRs 2+, Cranial nerves grossly intact Psych: Alert and Oriented x3. No memory deficits. Normal mood and affect.  MS: normal gait, normal bilateral lower extremity ROM/strength/stability.  Pelvic exam: (female chaperone present) is not limited by body habitus EGBUS: within normal limits Vagina: within normal limits and with normal mucosa  Cervix: 6-7, 90, -1 to 0, vertex, BBOW   Baseline FHR: 120 beats/min   Variability: moderate   Accelerations: present   Decelerations: absent- has only been on monitor 10 minutes Contractions: present frequency: every 2-4 minutes Overall assessment: reassuring    Lab Results  Component Value Date   [place] NEGATIVE 06/02/2019  Current covid test pending  Assessment:  Alexandria Owen is a 30 y.o. G44P1001 female at [redacted]w[redacted]d with active labor.   Plan:  Admit to Labor & Delivery  CBC, T&S, Clrs, IVF GBS negative.   Fetal well-being: Category I Expectant management for vaginal delivery    Rod Can, [facility] 02/04/2021 8:47 PM

## 2021-02-04 NOTE — Anesthesia Procedure Notes (Signed)
Epidural Patient location during procedure: OB Start time: 02/04/2021 11:10 PM End time: 02/04/2021 11:31 PM  Staffing Anesthesiologist: Norval Gable, MD Performed: anesthesiologist   Preanesthetic Checklist Completed: patient identified, IV checked, site marked, risks and benefits discussed, surgical consent, monitors and equipment checked, pre-op evaluation and timeout performed  Epidural Patient position: sitting Prep: ChloraPrep Patient monitoring: heart rate, continuous pulse ox and blood pressure Approach: midline Location: L3-L4 Injection technique: LOR saline  Needle:  Needle type: Tuohy  Needle gauge: 17 G Needle length: 9 cm and 9 Needle insertion depth: 5 cm Catheter type: closed end flexible Catheter size: 19 Gauge Catheter at skin depth: 12 cm Test dose: negative and 1.5% lidocaine with Epi 1:200 K  Assessment Sensory level: T10 Events: blood not aspirated, injection not painful, no injection resistance, no paresthesia and negative IV test  Additional Notes 1 attempt Pt. Evaluated and documentation done after procedure finished. Patient identified. Risks/Benefits/Options discussed with patient including but not limited to bleeding, infection, nerve damage, paralysis, failed block, incomplete pain control, headache, blood pressure changes, nausea, vomiting, reactions to medication both or allergic, itching and postpartum back pain. Confirmed with bedside nurse the patient's most recent platelet count. Confirmed with patient that they are not currently taking any anticoagulation, have any bleeding history or any family history of bleeding disorders. Patient expressed understanding and wished to proceed. All questions were answered. Sterile technique was used throughout the entire procedure. Please see nursing notes for vital signs. Test dose was given through epidural catheter and negative prior to continuing to dose epidural or start infusion. Warning signs of high  block given to the patient including shortness of breath, tingling/numbness in hands, complete motor block, or any concerning symptoms with instructions to call for help. Patient was given instructions on fall risk and not to get out of bed. All questions and concerns addressed with instructions to call with any issues or inadequate analgesia.    Patient tolerated the insertion well without immediate complications.Reason for block:procedure for pain

## 2021-02-04 NOTE — Anesthesia Preprocedure Evaluation (Signed)
Anesthesia Evaluation  Patient identified by MRN, date of birth, ID band Patient awake    History of Anesthesia Complications (+) history of anesthetic complications  Airway Mallampati: II   Neck ROM: Full    Dental no notable dental hx.    Pulmonary neg pulmonary ROS,           Cardiovascular negative cardio ROS   Rhythm:Regular Rate:Normal     Neuro/Psych    GI/Hepatic negative GI ROS, Neg liver ROS,   Endo/Other    Renal/GU negative Renal ROS  negative genitourinary   Musculoskeletal negative musculoskeletal ROS (+)   Abdominal   Peds negative pediatric ROS (+)  Hematology negative hematology ROS (+)   Anesthesia Other Findings   Reproductive/Obstetrics (+) Pregnancy                             Anesthesia Physical Anesthesia Plan  ASA: 2 and emergent  Anesthesia Plan: Epidural   Post-op Pain Management:    Induction:   PONV Risk Score and Plan: 1  Airway Management Planned:   Additional Equipment:   Intra-op Plan:   Post-operative Plan:   Informed Consent: I have reviewed the patients History and Physical, chart, labs and discussed the procedure including the risks, benefits and alternatives for the proposed anesthesia with the patient or authorized representative who has indicated his/her understanding and acceptance.       Plan Discussed with:   Anesthesia Plan Comments:         Anesthesia Quick Evaluation

## 2021-02-05 ENCOUNTER — Encounter: Payer: Self-pay | Admitting: Obstetrics and Gynecology

## 2021-02-05 DIAGNOSIS — O480 Post-term pregnancy: Secondary | ICD-10-CM

## 2021-02-05 DIAGNOSIS — Z3A40 40 weeks gestation of pregnancy: Secondary | ICD-10-CM

## 2021-02-05 LAB — CBC
HCT: 30.6 % — ABNORMAL LOW (ref 36.0–46.0)
Hemoglobin: 11 g/dL — ABNORMAL LOW (ref 12.0–15.0)
MCH: 30.6 pg (ref 26.0–34.0)
MCHC: 35.9 g/dL (ref 30.0–36.0)
MCV: 85.2 fL (ref 80.0–100.0)
Platelets: 163 10*3/uL (ref 150–400)
RBC: 3.59 MIL/uL — ABNORMAL LOW (ref 3.87–5.11)
RDW: 12.6 % (ref 11.5–15.5)
WBC: 14.1 10*3/uL — ABNORMAL HIGH (ref 4.0–10.5)
nRBC: 0 % (ref 0.0–0.2)

## 2021-02-05 LAB — RPR: RPR Ser Ql: NONREACTIVE

## 2021-02-05 MED ORDER — COCONUT OIL OIL
1.0000 "application " | TOPICAL_OIL | Status: DC | PRN
  Administered 2021-02-05: 1 via TOPICAL
  Filled 2021-02-05: qty 120

## 2021-02-05 MED ORDER — PHENYLEPHRINE 40 MCG/ML (10ML) SYRINGE FOR IV PUSH (FOR BLOOD PRESSURE SUPPORT)
80.0000 ug | PREFILLED_SYRINGE | INTRAVENOUS | Status: DC | PRN
  Filled 2021-02-05: qty 10

## 2021-02-05 MED ORDER — ACETAMINOPHEN 325 MG PO TABS
650.0000 mg | ORAL_TABLET | ORAL | Status: DC | PRN
  Filled 2021-02-05: qty 2

## 2021-02-05 MED ORDER — BENZOCAINE-MENTHOL 20-0.5 % EX AERO
1.0000 "application " | INHALATION_SPRAY | CUTANEOUS | Status: DC | PRN
  Administered 2021-02-05: 1 via TOPICAL
  Filled 2021-02-05: qty 56

## 2021-02-05 MED ORDER — IBUPROFEN 600 MG PO TABS
600.0000 mg | ORAL_TABLET | Freq: Four times a day (QID) | ORAL | Status: DC
  Administered 2021-02-05 – 2021-02-06 (×4): 600 mg via ORAL
  Filled 2021-02-05 (×5): qty 1

## 2021-02-05 MED ORDER — WITCH HAZEL-GLYCERIN EX PADS
1.0000 "application " | MEDICATED_PAD | CUTANEOUS | Status: DC | PRN
  Administered 2021-02-05: 1 via TOPICAL
  Filled 2021-02-05: qty 100

## 2021-02-05 MED ORDER — DIPHENHYDRAMINE HCL 25 MG PO CAPS: 25.0000 mg | ORAL_CAPSULE | Freq: Four times a day (QID) | ORAL | Status: DC | PRN

## 2021-02-05 MED ORDER — SIMETHICONE 80 MG PO CHEW
80.0000 mg | CHEWABLE_TABLET | ORAL | Status: DC | PRN
Start: 2021-02-05 — End: 2021-02-06

## 2021-02-05 MED ORDER — DIPHENHYDRAMINE HCL 50 MG/ML IJ SOLN: 12.5000 mg | INTRAMUSCULAR | Status: DC | PRN

## 2021-02-05 MED ORDER — ONDANSETRON HCL 4 MG PO TABS: 4.0000 mg | ORAL_TABLET | ORAL | Status: DC | PRN

## 2021-02-05 MED ORDER — EPHEDRINE 5 MG/ML INJ
10.0000 mg | INTRAVENOUS | Status: DC | PRN
  Filled 2021-02-05: qty 2

## 2021-02-05 MED ORDER — ONDANSETRON HCL 4 MG/2ML IJ SOLN: 4.0000 mg | INTRAMUSCULAR | Status: DC | PRN

## 2021-02-05 MED ORDER — PRENATAL MULTIVITAMIN CH
1.0000 | ORAL_TABLET | Freq: Every day | ORAL | Status: DC
  Administered 2021-02-05: 1 via ORAL
  Filled 2021-02-05: qty 1

## 2021-02-05 MED ORDER — TETANUS-DIPHTH-ACELL PERTUSSIS 5-2.5-18.5 LF-MCG/0.5 IM SUSY: 0.5000 mL | PREFILLED_SYRINGE | Freq: Once | INTRAMUSCULAR | Status: DC

## 2021-02-05 MED ORDER — SENNOSIDES-DOCUSATE SODIUM 8.6-50 MG PO TABS
2.0000 | ORAL_TABLET | Freq: Every day | ORAL | Status: DC
  Administered 2021-02-06: 2 via ORAL
  Filled 2021-02-05: qty 2

## 2021-02-05 MED ORDER — DIBUCAINE (PERIANAL) 1 % EX OINT
1.0000 "application " | TOPICAL_OINTMENT | CUTANEOUS | Status: DC | PRN
  Administered 2021-02-05: 1 via RECTAL
  Filled 2021-02-05: qty 28

## 2021-02-05 MED ORDER — FENTANYL-BUPIVACAINE-NACL 0.5-0.125-0.9 MG/250ML-% EP SOLN: 12.0000 mL/h | EPIDURAL | Status: DC | PRN

## 2021-02-05 MED ORDER — LACTATED RINGERS IV SOLN: 500.0000 mL | Freq: Once | INTRAVENOUS | Status: DC

## 2021-02-05 NOTE — Plan of Care (Signed)
Transferred to Room 337. Alert and oriented with pleasant affect. Color good, skin w&d. Assessment and VS WNL. Pt. Oriented to Safety and Security, Fall Prevention and POC. Pt. V/O.

## 2021-02-05 NOTE — Discharge Summary (Signed)
OB Discharge Summary     Patient Name: Alexandria Owen DOB: [birth year] MRN: [id number]  Date of admission: 02/04/2021 Delivering provider: Rod Can, CNM  Date of Delivery: 02/05/2021  Date of discharge: 02/06/2021  Admitting diagnosis: Encounter for induction of labor [Z34.90] Intrauterine pregnancy: [redacted]w[redacted]d     Secondary diagnosis: None     Discharge diagnosis: Term Pregnancy Delivered                                                                                                Post partum procedures: none  Augmentation: AROM  Complications: None  Hospital course:  Onset of Labor With Vaginal Delivery      30 y.o. yo G2P1001 at [redacted]w[redacted]d was admitted in Active Labor on 02/04/2021. Patient had an uncomplicated labor course as follows:  Membrane Rupture Time/Date: 12:24 AM ,02/05/2021   Delivery Method:Vaginal, Spontaneous  Episiotomy: None  Lacerations:  Periurethral  Details of delivery can be found in delivery note Patient had an uncomplicated postpartum course.    Patient is discharged home in stable condition on 02/06/21.  Newborn Data: Birth date:02/05/2021  Birth time:2:39 AM  Gender:Female Tobin Living status:Living  Apgars:7 ,9  Weight: 4150g, 9#2oz  Physical exam  Vitals:   02/05/21 1631 02/05/21 1954 02/05/21 2348 02/06/21 0754  BP: 101/67 100/73 102/70 105/68  Pulse: 78 70 78 66  Resp: 18 20 20 18   Temp: 98.2 F (36.8 C) 97.6 F (36.4 C) 97.8 F (36.6 C) 98 F (36.7 C)  TempSrc: Oral Oral Oral Oral  SpO2: 99% 99%  98%  Weight:      Height:       General: alert, cooperative, and no distress Lochia: appropriate Uterine Fundus: firm Incision: N/A DVT Evaluation: No evidence of DVT seen on physical exam. Negative Homan's sign.  Labs: Lab Results  Component Value Date   WBC 14.1 (H) 02/05/2021   HGB 11.0 (L) 02/05/2021   HCT 30.6 (L) 02/05/2021   MCV 85.2 02/05/2021   PLT 163 02/05/2021    Discharge instruction: per After Visit  Summary.  Medications:  Allergies as of 02/06/2021   No Known Allergies      Medication List     TAKE these medications    ibuprofen 600 MG tablet Commonly known as: ADVIL Take 1 tablet (600 mg total) by mouth every 6 (six) hours.   multivitamin-prenatal 27-0.8 MG Tabs tablet Take 1 tablet by mouth daily at 12 noon.        Diet: routine diet  Activity: Advance as tolerated. Pelvic rest for 6 weeks.   Outpatient follow up:  Follow-up Information     Rod Can, CNM. Schedule an appointment as soon as possible for a visit in 6 week(s).   Specialty: Obstetrics Why: Call and make a follow-up appointment with Rod Can, CNM for a visit in 6-weeks at [facility]! Contact information: [place] [phone]                   Postpartum contraception: IUD Paragard Rhogam Given postpartum: NA Rubella vaccine given postpartum: immune Varicella vaccine given postpartum:  immune TDaP given antepartum or postpartum: given antepartally    Newborn Delivery   Birth date/time: 02/05/2021 02:39:00 Delivery type: Vaginal, Spontaneous       Baby Feeding: Breast  Disposition:home with mother  SIGNED:  Imagene Riches, [facility] 02/06/2021 9:54 AM

## 2021-02-05 NOTE — Progress Notes (Signed)
Subjective:  Doing well 9 hours postpartum: she is tolerating regular diet, her pain is controlled with PO medications, she is ambulating and voiding without difficulty, she reports breastfeeding is going well.  Objective:  Vital signs in last 24 hours: Temp:  [97.9 F (36.6 C)-98.3 F (36.8 C)] 98.3 F (36.8 C) (06/26 1215) Pulse Rate:  [76-121] 76 (06/26 1215) Resp:  [18] 18 (06/26 1215) BP: (99-126)/(52-83) 108/72 (06/26 1215) SpO2:  [97 %-100 %] 97 % (06/26 1215) Weight:  [80.7 kg] 80.7 kg (06/25 2040)    General: NAD Pulmonary: no increased work of breathing Abdomen: non-distended, non-tender, fundus firm at level of umbilicus Extremities: no edema, no erythema, no tenderness  Results for orders placed or performed during the hospital encounter of 02/04/21 (from the past 72 hour(s))  CBC     Status: Abnormal   Collection Time: 02/04/21  8:51 PM  Result Value Ref Range   WBC 16.5 (H) 4.0 - 10.5 K/uL   RBC 3.84 (L) 3.87 - 5.11 MIL/uL   Hemoglobin 11.7 (L) 12.0 - 15.0 g/dL   HCT 33.0 (L) 36.0 - 46.0 %   MCV 85.9 80.0 - 100.0 fL   MCH 30.5 26.0 - 34.0 pg   MCHC 35.5 30.0 - 36.0 g/dL   RDW 12.6 11.5 - 15.5 %   Platelets 181 150 - 400 K/uL   nRBC 0.0 0.0 - 0.2 %    Comment: Performed at [facility], [address] [place], [place] [address]  Type and screen     Status: None   Collection Time: 02/04/21  8:51 PM  Result Value Ref Range   ABO/RH(D) O POS    Antibody Screen NEG    Sample Expiration      02/07/2021,2359 Performed at [facility], [address] [place], [place] [address]   RPR     Status: None   Collection Time: 02/04/21  8:51 PM  Result Value Ref Range   RPR Ser Ql NON REACTIVE NON REACTIVE    Comment: Performed at [facility], [address] [place], [place] [address]  Resp Panel by RT-PCR (Flu A&B, Covid) Nasopharyngeal Swab     Status: None   Collection Time: 02/04/21  8:51 PM   Specimen: Nasopharyngeal Swab;  Nasopharyngeal(NP) swabs in vial transport medium  Result Value Ref Range   SARS Coronavirus 2 by RT PCR NEGATIVE NEGATIVE    Comment: (NOTE) SARS-CoV-2 target nucleic acids are NOT DETECTED.  The SARS-CoV-2 RNA is generally detectable in upper respiratory specimens during the acute phase of infection. The lowest concentration of SARS-CoV-2 viral copies this assay can detect is 138 copies/mL. A negative result does not preclude SARS-Cov-2 infection and should not be used as the sole basis for treatment or other patient management decisions. A negative result may occur with  improper specimen collection/handling, submission of specimen other than nasopharyngeal swab, presence of viral mutation(s) within the areas targeted by this assay, and inadequate number of viral copies(<138 copies/mL). A negative result must be combined with clinical observations, patient history, and epidemiological information. The expected result is Negative.  Fact Sheet for Patients:  EntrepreneurPulse.com.au  Fact Sheet for Healthcare Providers:  IncredibleEmployment.be  This test is no t yet approved or cleared by the [place] FDA and  has been authorized for detection and/or diagnosis of SARS-CoV-2 by FDA under an Emergency Use Authorization (EUA). This EUA will remain  in effect (meaning this test can be used) for the duration  of the COVID-19 declaration under Section 564(b)(1) of the Act, 21 U.S.C.section 360bbb-3(b)(1), unless the authorization is terminated  or revoked sooner.       Influenza A by PCR NEGATIVE NEGATIVE   Influenza B by PCR NEGATIVE NEGATIVE    Comment: (NOTE) The Xpert Xpress SARS-CoV-2/FLU/RSV plus assay is intended as an aid in the diagnosis of influenza from Nasopharyngeal swab specimens and should not be used as a sole basis for treatment. Nasal washings and aspirates are unacceptable for Xpert Xpress  SARS-CoV-2/FLU/RSV testing.  Fact Sheet for Patients: EntrepreneurPulse.com.au  Fact Sheet for Healthcare Providers: IncredibleEmployment.be  This test is not yet approved or cleared by the [place] FDA and has been authorized for detection and/or diagnosis of SARS-CoV-2 by FDA under an Emergency Use Authorization (EUA). This EUA will remain in effect (meaning this test can be used) for the duration of the COVID-19 declaration under Section 564(b)(1) of the Act, 21 U.S.C. section 360bbb-3(b)(1), unless the authorization is terminated or revoked.  Performed at [facility], [address] [place], [place] [address]   CBC     Status: Abnormal   Collection Time: 02/05/21 10:10 AM  Result Value Ref Range   WBC 14.1 (H) 4.0 - 10.5 K/uL   RBC 3.59 (L) 3.87 - 5.11 MIL/uL   Hemoglobin 11.0 (L) 12.0 - 15.0 g/dL   HCT 30.6 (L) 36.0 - 46.0 %   MCV 85.2 80.0 - 100.0 fL   MCH 30.6 26.0 - 34.0 pg   MCHC 35.9 30.0 - 36.0 g/dL   RDW 12.6 11.5 - 15.5 %   Platelets 163 150 - 400 K/uL   nRBC 0.0 0.0 - 0.2 %    Comment: Performed at [facility], [address] [place], [place] [address]    Assessment:   30 y.o. [phone] postpartum day # 0, lactating  Plan:    1) Acute blood loss anemia - hemodynamically stable and asymptomatic - po ferrous sulfate  2) Blood Type --/--/O POS (06/25 2051) / Rubella 1.56 (11/12 1228) / Varicella Immune  3) TDAP status  given antepartum  4) Feeding plan breast  5)  Education given regarding options for contraception, as well as compatibility with breast feeding if applicable.  Patient plans on Paragard IUD for contraception.  6) Disposition: continue current care, likely discharge tomorrow   Rod Can, [facility] 02/05/2021, 12:20 PM

## 2021-02-05 NOTE — Progress Notes (Signed)
  Labor Progress Note   30 y.o. G2P1001 @ [redacted]w[redacted]d , admitted for  Pregnancy, Labor Management. Active labor  Subjective:  She got an epidural and is still feeling pain on right side. Will let anesthesia know if it doesn't resolve.  Objective:  BP 116/73   Pulse 85   Temp 98.2 F (36.8 C) (Oral)   Resp 18   Ht 5\' 8"  (1.727 m)   Wt 80.7 kg   LMP 04/29/2020   SpO2 100%   BMI 27.06 kg/m  Abd: gravid, ND, FHT present, mild tenderness on exam Extr: no edema SVE: 7.5, 90 with some swelling anterior, -1 AROM clear with bloody show  EFM: FHR: 115 bpm, variability: moderate,  accelerations:  Present,  decelerations:  Absent Toco: Frequency: Every 1-4 minutes Labs: I have reviewed the patient's lab results.   Assessment & Plan:  G2P1001 @ [redacted]w[redacted]d, admitted for  Pregnancy and Labor/Delivery Management  1. Pain management: epidural. 2. FWB: FHT category I.  3. ID: GBS negative 4. Labor management: expectant management for SVD  All discussed with patient, see orders   Rod Can, [facility] 02/05/2021  12:31 AM

## 2021-02-05 NOTE — Discharge Instructions (Addendum)
Discharge Instructions:   Follow-up Appointment: Call and make a follow-up appointment with Rod Can, CNM for a visit in 6-weeks at [facility]!  If there are any new medications, they have been ordered and will be available for pickup at the listed pharmacy on your way home from the hospital.   Call office if you have any of the following: headache, visual changes, fever >101.0 F, chills, shortness of breath, breast concerns, excessive vaginal bleeding, incision drainage or problems, leg pain or redness, depression or any other concerns. If you have vaginal discharge with an odor, let your doctor know.   It is normal to bleed for up to 6 weeks. You should not soak through more than 1 pad in 1 hour. If you have a blood clot larger than your fist with continued bleeding, call your doctor.   Activity: Do not lift > 10 lbs for 6 weeks (do not lift anything heavier than your baby). No intercourse, tampons, swimming pools, hot tubs, baths (only showers) for 6 weeks.  No driving for 1-2 weeks. Continue prenatal vitamin, especially if breastfeeding. Increase calories and fluids (water) while breastfeeding.   Your milk will come in, in the next couple of days (right now it is colostrum). You may have a slight fever when your milk comes in, but it should go away on its own.  If it does not, and rises above 101 F please call the doctor. You will also feel achy and your breasts will be firm. They will also start to leak. If you are breastfeeding, continue as you have been and you can pump/express milk for comfort.   If you have too much milk, your breasts can become engorged, which could lead to mastitis. This is an infection of the milk ducts. It can be very painful and you will need to notify your doctor to obtain a prescription for antibiotics. You can also treat it with a shower or hot/cold compress.   For concerns about your baby, please call your pediatrician.  For breastfeeding concerns,  the lactation consultant can be reached at [phone].   Postpartum blues (feelings of happy one minute and sad another minute) are normal for the first few weeks but if it gets worse let your doctor know.   Congratulations! We enjoyed caring for you and your new bundle of joy!

## 2021-02-06 MED ORDER — IBUPROFEN 600 MG PO TABS
600.0000 mg | ORAL_TABLET | Freq: Four times a day (QID) | ORAL | 0 refills | Status: DC
Start: 2021-02-06 — End: 2021-03-24

## 2021-02-06 NOTE — Plan of Care (Signed)
D/C order from M. Rosann Auerbach CNM.  Reviewed d/c instructions with patient and answered any questions. Patient to schedule follow up appointment in 6 weeks. Patient d/c home with infant.

## 2021-02-06 NOTE — Anesthesia Postprocedure Evaluation (Signed)
Anesthesia Post Note  Patient: Alexandria Owen  Procedure(s) Performed: AN AD HOC LABOR EPIDURAL  Patient location during evaluation: Mother Baby Anesthesia Type: Epidural Level of consciousness: awake and alert Pain management: pain level controlled Vital Signs Assessment: post-procedure vital signs reviewed and stable Respiratory status: spontaneous breathing, nonlabored ventilation and respiratory function stable Cardiovascular status: stable Postop Assessment: no headache, no backache and epidural receding Anesthetic complications: no   No notable events documented.   Last Vitals:  Vitals:   02/05/21 1954 02/05/21 2348  BP: 100/73 102/70  Pulse: 70 78  Resp: 20 20  Temp: 36.4 C 36.6 C  SpO2: 99%     Last Pain:  Vitals:   02/06/21 0506  TempSrc:   PainSc: 0-No pain                 [name] [name]

## 2021-02-20 ENCOUNTER — Telehealth: Payer: Self-pay

## 2021-02-20 NOTE — Telephone Encounter (Signed)
Patient is scheduled for paraguard placement on 03/24/21 with JEG

## 2021-02-21 NOTE — Telephone Encounter (Signed)
Noted. Will order to arrive by apt date/time. 

## 2021-03-17 ENCOUNTER — Ambulatory Visit: Payer: Managed Care, Other (non HMO) | Admitting: Advanced Practice Midwife

## 2021-03-24 ENCOUNTER — Ambulatory Visit (INDEPENDENT_AMBULATORY_CARE_PROVIDER_SITE_OTHER): Payer: Managed Care, Other (non HMO) | Admitting: Advanced Practice Midwife

## 2021-03-24 ENCOUNTER — Encounter: Payer: Self-pay | Admitting: Advanced Practice Midwife

## 2021-03-24 ENCOUNTER — Other Ambulatory Visit: Payer: Self-pay

## 2021-03-24 DIAGNOSIS — Z30430 Encounter for insertion of intrauterine contraceptive device: Secondary | ICD-10-CM | POA: Diagnosis not present

## 2021-03-24 NOTE — Progress Notes (Signed)
Postpartum Visit  Chief Complaint:  Chief Complaint  Patient presents with   Postpartum Care   Contraception    Interested in [place]    History of Present Illness: Patient is a 30 y.o. [id number] presents for postpartum visit.  Review the Delivery Report for details.   Date of delivery: 02/05/2021 Type of delivery: Vaginal delivery - Vacuum or forceps assisted  no Episiotomy No.  Laceration: no  Pregnancy or labor problems:  no Any problems since the delivery:  no her bleeding stopped 2 weeks ago and she has not yet had a period  Newborn Details:  SINGLETON :  1. BabyGender female. Birth weight: 9 pounds 2 ounces Maternal Details:  Breast or formula feeding: breastfeeding Intercourse: Yes  condom used Contraception after delivery:  Paragard IUD Any bowel or bladder issues: No  Post partum depression/anxiety noted:  no Edinburgh Post-Partum Depression Score: 3 Date of last PAP: 2021  no abnormalities   Review of Systems: Review of Systems  Constitutional:  Negative for chills and fever.  HENT:  Negative for congestion, ear discharge, ear pain, hearing loss, sinus pain and sore throat.   Eyes:  Negative for blurred vision and double vision.  Respiratory:  Negative for cough, shortness of breath and wheezing.   Cardiovascular:  Negative for chest pain, palpitations and leg swelling.  Gastrointestinal:  Negative for abdominal pain, blood in stool, constipation, diarrhea, heartburn, melena, nausea and vomiting.  Genitourinary:  Negative for dysuria, flank pain, frequency, hematuria and urgency.  Musculoskeletal:  Negative for back pain, joint pain and myalgias.  Skin:  Negative for itching and rash.  Neurological:  Negative for dizziness, tingling, tremors, sensory change, speech change, focal weakness, seizures, loss of consciousness, weakness and headaches.  Endo/Heme/Allergies:  Negative for environmental allergies. Does not bruise/bleed easily.   Psychiatric/Behavioral:  Negative for depression, hallucinations, memory loss, substance abuse and suicidal ideas. The patient is not nervous/anxious and does not have insomnia.      Past Medical History:  Past Medical History:  Diagnosis Date   Allergic rhinitis    Vaccine for human papilloma virus (HPV) types 6, 11, 16, and 18 administered     Past Surgical History:  Past Surgical History:  Procedure Laterality Date   NO PAST SURGERIES      Family History:  Family History  Problem Relation Age of Onset   Breast cancer Neg Hx    Ovarian cancer Neg Hx     Social History:  Social History   Socioeconomic History   Marital status: Married    Spouse name: Not on file   Number of children: Not on file   Years of education: Not on file   Highest education level: Not on file  Occupational History   Not on file  Tobacco Use   Smoking status: Never   Smokeless tobacco: Never  Vaping Use   Vaping Use: Never used  Substance and Sexual Activity   Alcohol use: Not Currently    Comment: occasional   Drug use: No   Sexual activity: Yes    Birth control/protection: Condom, None  Other Topics Concern   Not on file  Social History Narrative   Not on file   Social Determinants of Health   Financial Resource Strain: Not on file  Food Insecurity: Not on file  Transportation Needs: Not on file  Physical Activity: Not on file  Stress: Not on file  Social Connections: Not on file  Intimate Partner Violence: Not on  file    Allergies:  No Known Allergies  Medications: Prior to Admission medications   Medication Sig Start Date End Date Taking? Authorizing Provider  Prenatal Vit-Fe Fumarate-FA (MULTIVITAMIN-PRENATAL) 27-0.8 MG TABS tablet Take 1 tablet by mouth daily at 12 noon.   Yes [provider]    Physical Exam Blood pressure 110/80, height '5\' 8"'$  (1.727 m), currently breastfeeding.    General: NAD HEENT: normocephalic, anicteric Pulmonary: No  increased work of breathing Abdomen: NABS, soft, non-tender, non-distended.  Umbilicus without lesions.  No hepatomegaly, splenomegaly or masses palpable. No evidence of hernia. Genitourinary:  External: Normal external female genitalia.  Normal urethral meatus, normal  Bartholin's and Skene's glands.    Vagina: Normal vaginal mucosa, no evidence of prolapse.    Cervix: Grossly normal in appearance, no bleeding, no CMT  Uterus: Non-enlarged, mobile, normal contour.    Adnexa: ovaries non-enlarged, no adnexal masses  Rectal: deferred Extremities: no edema, erythema, or tenderness Neurologic: Grossly intact Psychiatric: mood appropriate, affect full   Edinburgh Postnatal Depression Scale - 03/24/21 1030       Edinburgh Postnatal Depression Scale:  In the Past 7 Days   I have been able to laugh and see the funny side of things. 0    I have looked forward with enjoyment to things. 0    I have blamed myself unnecessarily when things went wrong. 1    I have been anxious or worried for no good reason. 1    I have felt scared or panicky for no good reason. 1    Things have been getting on top of me. 0    I have been so unhappy that I have had difficulty sleeping. 0    I have felt sad or miserable. 0    I have been so unhappy that I have been crying. 0    The thought of harming myself has occurred to me. 0    Edinburgh Postnatal Depression Scale Total 3             Assessment: 30 y.o. [id number] presenting for 6 week postpartum visit  Plan: Problem List Items Addressed This Visit   None Visit Diagnoses     6 weeks postpartum follow-up    -  Primary        1) Contraception - Education given regarding options for contraception, as well as compatibility with breast feeding if applicable.  Patient plans on IUD for contraception.  2)  Pap - ASCCP guidelines and rationale discussed.  Patient opts for every 3 years screening interval  3) Patient underwent screening for postpartum  depression with no signs of depression  4) Return in about 4 weeks (around 04/21/2021) for IUD string check.   Rod Can, [facility] 03/24/2021, 10:54 AM       GYNECOLOGY OFFICE PROCEDURE NOTE  Alexandria Owen is a 30 y.o. [phone] here for Paragard IUD insertion. No GYN concerns.  Last pap smear was 2021 and was normal.  The patient is currently using condom for contraception and her LMP is No LMP recorded..  The indication for her IUD is contraception.  IUD Insertion Procedure Note Patient identified, informed consent performed, consent signed.   Discussed risks of irregular bleeding, cramping, infection, malpositioning, expulsion or uterine perforation of the IUD (1:1000 placements)  which may require further procedure such as laparoscopy.  IUD while effective at preventing pregnancy do not prevent transmission of sexually transmitted diseases and use of barrier methods  for this purpose was discussed. Time out was performed.    Speculum placed in the vagina.  Cervix visualized.  Cleaned with Betadine x 2.  Grasped posteriorly with a single tooth tenaculum.  Uterus sounded to 7.5 cm. IUD placed per manufacturer's recommendations.  Strings trimmed to 3 cm. Tenaculum was removed, good hemostasis noted.  Patient tolerated procedure well.   Patient was given post-procedure instructions.  She was advised to have backup contraception for one week.  Patient was also asked to check IUD strings periodically and follow up in 4-6 weeks for IUD check.  IUD insertion CPT 58300,  Skyla J7301 Mirena J7298 [place] [id number] Verdia Kuba [phone] Modifer 25, plus Modifer 79 is done during a global billing visit    Rod Can, [facility]

## 2021-04-21 ENCOUNTER — Other Ambulatory Visit: Payer: Self-pay

## 2021-04-21 ENCOUNTER — Ambulatory Visit (INDEPENDENT_AMBULATORY_CARE_PROVIDER_SITE_OTHER): Payer: Managed Care, Other (non HMO) | Admitting: Advanced Practice Midwife

## 2021-04-21 ENCOUNTER — Encounter: Payer: Self-pay | Admitting: Advanced Practice Midwife

## 2021-04-21 VITALS — BP 100/60 | Ht 68.0 in | Wt 160.0 lb

## 2021-04-21 DIAGNOSIS — Z30431 Encounter for routine checking of intrauterine contraceptive device: Secondary | ICD-10-CM

## 2021-04-21 NOTE — Progress Notes (Signed)
Obstetrics & Gynecology Office Visit   Chief Complaint:  Chief Complaint  Patient presents with   iud check     History of Present Illness: 30 y.o. patient presenting for follow up of Paragard IUD placement 4 weeks ago.  The indication for her IUD was contraception.  She denies any complications since her IUD placement.  Still had spotting after placement of the IUD.  is able to feel strings.    Review of Systems: Review of Systems  Constitutional:  Negative for chills and fever.  HENT:  Negative for congestion, ear discharge, ear pain, hearing loss, sinus pain and sore throat.   Eyes:  Negative for blurred vision and double vision.  Respiratory:  Negative for cough, shortness of breath and wheezing.   Cardiovascular:  Negative for chest pain, palpitations and leg swelling.  Gastrointestinal:  Negative for abdominal pain, blood in stool, constipation, diarrhea, heartburn, melena, nausea and vomiting.  Genitourinary:  Negative for dysuria, flank pain, frequency, hematuria and urgency.  Musculoskeletal:  Negative for back pain, joint pain and myalgias.  Skin:  Negative for itching and rash.  Neurological:  Negative for dizziness, tingling, tremors, sensory change, speech change, focal weakness, seizures, loss of consciousness, weakness and headaches.  Endo/Heme/Allergies:  Negative for environmental allergies. Does not bruise/bleed easily.  Psychiatric/Behavioral:  Negative for depression, hallucinations, memory loss, substance abuse and suicidal ideas. The patient is not nervous/anxious and does not have insomnia.    Past Medical History:  Past Medical History:  Diagnosis Date   Allergic rhinitis    Vaccine for human papilloma virus (HPV) types 6, 11, 16, and 18 administered     Past Surgical History:  Past Surgical History:  Procedure Laterality Date   NO PAST SURGERIES      Gynecologic History: No LMP recorded.  Obstetric History: [id number]  Family History:  Family  History  Problem Relation Age of Onset   Breast cancer Neg Hx    Ovarian cancer Neg Hx     Social History:  Social History   Socioeconomic History   Marital status: Married    Spouse name: Not on file   Number of children: Not on file   Years of education: Not on file   Highest education level: Not on file  Occupational History   Not on file  Tobacco Use   Smoking status: Never   Smokeless tobacco: Never  Vaping Use   Vaping Use: Never used  Substance and Sexual Activity   Alcohol use: Not Currently    Comment: occasional   Drug use: No   Sexual activity: Yes    Birth control/protection: Condom, None  Other Topics Concern   Not on file  Social History Narrative   Not on file   Social Determinants of Health   Financial Resource Strain: Not on file  Food Insecurity: Not on file  Transportation Needs: Not on file  Physical Activity: Not on file  Stress: Not on file  Social Connections: Not on file  Intimate Partner Violence: Not on file    Allergies:  No Known Allergies  Medications: Prior to Admission medications   Medication Sig Start Date End Date Taking? Authorizing Provider  Prenatal Vit-Fe Fumarate-FA (MULTIVITAMIN-PRENATAL) 27-0.8 MG TABS tablet Take 1 tablet by mouth daily at 12 noon.    [provider]    Physical Exam Blood pressure 100/60, height '5\' 8"'$  (1.727 m), weight 160 lb (72.6 kg), currently breastfeeding. No LMP recorded.  General: NAD HEENT: normocephalic,  anicteric Pulmonary: No increased work of breathing  Genitourinary:  External: Normal external female genitalia.  Normal urethral meatus, normal  Bartholin's and Skene's glands.    Vagina: Normal vaginal mucosa, no evidence of prolapse.    Cervix: Grossly normal appearance, no bleeding, IUD strings visualized 2cm Extremities: no edema, erythema, or tenderness Neurologic: Grossly intact Psychiatric: mood appropriate, affect full   Assessment: 30 y.o. [id number] IUD string  check   Plan: Problem List Items Addressed This Visit   None Visit Diagnoses     IUD check up    -  Primary        1.  The patient was given instructions to check her IUD strings monthly and call with any problems or concerns.  She should call for fevers, chills, abnormal vaginal discharge, pelvic pain, or other complaints.  2.   IUDs while effective at preventing pregnancy do not prevent transmission of sexually transmitted diseases and use of barrier methods for this purpose was discussed.  Low overall incidence of failure with 99.7% efficacy rate in typical use.  The patient has not contraindication to IUD placement.  3.  She will return for a annual exam in 1 year.  All questions answered.  4) A total of 15 minutes were spent in face-to-face contact with the patient during this encounter with over half of that time devoted to counseling and coordination of care.  5) Return if symptoms worsen or fail to improve.   Rod Can, [employer] 04/21/2021, 10:08 AM

## 2021-05-17 ENCOUNTER — Ambulatory Visit (INDEPENDENT_AMBULATORY_CARE_PROVIDER_SITE_OTHER): Payer: Managed Care, Other (non HMO) | Admitting: Dermatology

## 2021-05-17 ENCOUNTER — Other Ambulatory Visit: Payer: Self-pay

## 2021-05-17 ENCOUNTER — Encounter: Payer: Self-pay | Admitting: Dermatology

## 2021-05-17 DIAGNOSIS — L814 Other melanin hyperpigmentation: Secondary | ICD-10-CM

## 2021-05-17 DIAGNOSIS — D485 Neoplasm of uncertain behavior of skin: Secondary | ICD-10-CM | POA: Diagnosis not present

## 2021-05-17 DIAGNOSIS — L578 Other skin changes due to chronic exposure to nonionizing radiation: Secondary | ICD-10-CM

## 2021-05-17 DIAGNOSIS — L700 Acne vulgaris: Secondary | ICD-10-CM

## 2021-05-17 DIAGNOSIS — L821 Other seborrheic keratosis: Secondary | ICD-10-CM

## 2021-05-17 DIAGNOSIS — D2261 Melanocytic nevi of right upper limb, including shoulder: Secondary | ICD-10-CM

## 2021-05-17 DIAGNOSIS — D2262 Melanocytic nevi of left upper limb, including shoulder: Secondary | ICD-10-CM

## 2021-05-17 DIAGNOSIS — Z1283 Encounter for screening for malignant neoplasm of skin: Secondary | ICD-10-CM

## 2021-05-17 DIAGNOSIS — D229 Melanocytic nevi, unspecified: Secondary | ICD-10-CM

## 2021-05-17 DIAGNOSIS — L219 Seborrheic dermatitis, unspecified: Secondary | ICD-10-CM | POA: Diagnosis not present

## 2021-05-17 DIAGNOSIS — D1800 Hemangioma unspecified site: Secondary | ICD-10-CM

## 2021-05-17 MED ORDER — KETOCONAZOLE 2 % EX SHAM: 1.0000 "application " | MEDICATED_SHAMPOO | CUTANEOUS | 11 refills | Status: DC

## 2021-05-17 MED ORDER — CLINDAMYCIN PHOSPHATE 1 % EX SOLN: Freq: Every morning | CUTANEOUS | 12 refills | Status: AC

## 2021-05-17 MED ORDER — ADAPALENE 0.3 % EX GEL: 1.0000 "application " | Freq: Every day | CUTANEOUS | 12 refills | Status: DC

## 2021-05-17 NOTE — Patient Instructions (Signed)
Wound Care Instructions  Cleanse wound gently with soap and water once a day then pat dry with clean gauze. Apply a thing coat of Petrolatum (petroleum jelly, "Vaseline") over the wound (unless you have an allergy to this). We recommend that you use a new, sterile tube of Vaseline. Do not pick or remove scabs. Do not remove the yellow or white "healing tissue" from the base of the wound.  Cover the wound with fresh, clean, nonstick gauze and secure with paper tape. You may use Band-Aids in place of gauze and tape if the would is small enough, but would recommend trimming much of the tape off as there is often too much. Sometimes Band-Aids can irritate the skin.  You should call the office for your biopsy report after 1 week if you have not already been contacted.  If you experience any problems, such as abnormal amounts of bleeding, swelling, significant bruising, significant pain, or evidence of infection, please call the office immediately.  FOR ADULT SURGERY PATIENTS: If you need something for pain relief you may take 1 extra strength Tylenol (acetaminophen) AND 2 Ibuprofen (200mg each) together every 4 hours as needed for pain. (do not take these if you are allergic to them or if you have a reason you should not take them.) Typically, you may only need pain medication for 1 to 3 days.   If you have any questions or concerns for your doctor, please call our main line at [phone] and press option 4 to reach your doctor's medical assistant. If no one answers, please leave a voicemail as directed and we will return your call as soon as possible. Messages left after 4 pm will be answered the following business day.   You may also send us a message via MyChart. We typically respond to MyChart messages within 1-2 business days.  For prescription refills, please ask your pharmacy to contact our office. Our fax number is [phone].  If you have an urgent issue when the clinic is closed that  cannot wait until the next business day, you can page your doctor at the number below.    Please note that while we do our best to be available for urgent issues outside of office hours, we are not available 24/7.   If you have an urgent issue and are unable to reach us, you may choose to seek medical care at your doctor's office, retail clinic, urgent care center, or emergency room.  If you have a medical emergency, please immediately call 911 or go to the emergency department.  Pager Numbers  - Dr. Kowalski: [phone]  - Dr. Moye: [phone]  - Dr. Stewart: [phone]  In the event of inclement weather, please call our main line at [phone] for an update on the status of any delays or closures.  Dermatology Medication Tips: Please keep the boxes that topical medications come in in order to help keep track of the instructions about where and how to use these. Pharmacies typically print the medication instructions only on the boxes and not directly on the medication tubes.   If your medication is too expensive, please contact our office at [phone] option 4 or send us a message through MyChart.   We are unable to tell what your co-pay for medications will be in advance as this is different depending on your insurance coverage. However, we may be able to find a substitute medication at lower cost or fill out paperwork to get insurance to cover a needed   medication.   If a prior authorization is required to get your medication covered by your insurance company, please allow us 1-2 business days to complete this process.  Drug prices often vary depending on where the prescription is filled and some pharmacies may offer cheaper prices.  The website www.goodrx.com contains coupons for medications through different pharmacies. The prices here do not account for what the cost may be with help from insurance (it may be cheaper with your insurance), but the website can give you the  price if you did not use any insurance.  - You can print the associated coupon and take it with your prescription to the pharmacy.  - You may also stop by our office during regular business hours and pick up a GoodRx coupon card.  - If you need your prescription sent electronically to a different pharmacy, notify our office through Central MyChart or by phone at [phone] option 4.   

## 2021-05-17 NOTE — Progress Notes (Signed)
Follow-Up Visit   Subjective  Alexandria Owen is a 30 y.o. female who presents for the following: Annual Exam (Dry patch of left wrist that comes and goes). The patient presents for Total-Body Skin Exam (TBSE) for skin cancer screening and mole check. She complains of scaliness of the scalp occasionally.  She has several moles to be checked. She had to stop acne medicine while she was pregnant.  She delivered her baby and is currently breast-feeding.  She would like to get back on acne medicine.  The following portions of the chart were reviewed this encounter and updated as appropriate:   Tobacco  Allergies  Meds  Problems  Med Hx  Surg Hx  Fam Hx     Review of Systems:  No other skin or systemic complaints except as noted in HPI or Assessment and Plan.  Objective  Well appearing patient in no apparent distress; mood and affect are within normal limits.  A full examination was performed including scalp, head, eyes, ears, nose, lips, neck, chest, axillae, abdomen, back, buttocks, bilateral upper extremities, bilateral lower extremities, hands, feet, fingers, toes, fingernails, and toenails. All findings within normal limits unless otherwise noted below.  Face Mild comedones, some inflamed  Scalp Pinkness and scale  Left Forearm - Posterior 0.4 cm dark brown macule  Upper back spinal above braline 0.6 cm irregular brown macule  Right Shoulder - Posterior Speckled brown macule   Assessment & Plan   Lentigines - Scattered tan macules - Due to sun exposure - Benign-appearing, observe - Recommend daily broad spectrum sunscreen SPF 30+ to sun-exposed areas, reapply every 2 hours as needed. - Call for any changes  Seborrheic Keratoses - Stuck-on, waxy, tan-brown papules and/or plaques  - Benign-appearing - Discussed benign etiology and prognosis. - Observe - Call for any changes  Melanocytic Nevi - Tan-brown and/or pink-flesh-colored symmetric macules and  papules - Benign appearing on exam today - Observation - Call clinic for new or changing moles - Recommend daily use of broad spectrum spf 30+ sunscreen to sun-exposed areas.   Hemangiomas - Red papules - Discussed benign nature - Observe - Call for any changes  Actinic Damage - Chronic condition, secondary to cumulative UV/sun exposure - diffuse scaly erythematous macules with underlying dyspigmentation - Recommend daily broad spectrum sunscreen SPF 30+ to sun-exposed areas, reapply every 2 hours as needed.  - Staying in the shade or wearing long sleeves, sun glasses (UVA+UVB protection) and wide brim hats (4-inch brim around the entire circumference of the hat) are also recommended for sun protection.  - Call for new or changing lesions.  Skin cancer screening performed today.  Acne vulgaris Face Chronic and persistent Restart Differin 0.3% gel qhs, Clindamycin 1% lotion qam  Adapalene (DIFFERIN) 0.3 % gel - Face Apply 1 application topically at bedtime.  clindamycin (CLEOCIN T) 1 % external solution - Face Apply topically in the morning.  Seborrheic dermatitis Scalp Seborrheic Dermatitis  -  is a chronic persistent rash characterized by pinkness and scaling most commonly of the mid face but also can occur on the scalp (dandruff), ears; mid chest, mid back and groin.  It tends to be exacerbated by stress and cooler weather.  People who have neurologic disease may experience new onset or exacerbation of existing seborrheic dermatitis.  The condition is not curable but treatable and can be controlled.  Start Ketoconazole 2% shampoo 2 times per week - leave on ~5 minutes before rinsing  ketoconazole (NIZORAL) 2 % shampoo -  Scalp Apply 1 application topically 2 (two) times a week. Leave on about 5 minutes before rinsing  Nevus Left Forearm - Posterior Stable compared to photograph and measurement Benign-appearing.  Observation.  Call clinic for new or changing lesions.   Recommend daily use of broad spectrum spf 30+ sunscreen to sun-exposed areas.   Neoplasm of uncertain behavior of skin Upper back spinal above braline Epidermal / dermal shaving  Lesion diameter (cm):  0.6 Informed consent: discussed and consent obtained   Timeout: patient name, date of birth, surgical site, and procedure verified   Procedure prep:  Patient was prepped and draped in usual sterile fashion Prep type:  Isopropyl alcohol Anesthesia: the lesion was anesthetized in a standard fashion   Anesthetic:  1% lidocaine w/ epinephrine 1-100,000 buffered w/ 8.4% NaHCO3 Instrument used: flexible razor blade   Hemostasis achieved with: pressure, aluminum chloride and electrodesiccation   Outcome: patient tolerated procedure well   Post-procedure details: sterile dressing applied and wound care instructions given   Dressing type: bandage and petrolatum    Anatomic Pathology Report  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  I, [name], CMA, am acting as scribe for Sarina Ser, MD . Documentation: I have reviewed the above documentation for accuracy and completeness, and I agree with the above.  Sarina Ser, MD

## 2021-05-26 LAB — ANATOMIC PATHOLOGY REPORT

## 2021-05-29 ENCOUNTER — Telehealth: Payer: Self-pay

## 2021-05-29 NOTE — Telephone Encounter (Signed)
-----   Message from Ralene Bathe, MD sent at 05/29/2021 10:00 AM EDT ----- Diagnosis: Comment  Comment: Specimen 1-COMPOUND MELANOCYTIC NEVUS.  Benign mole No further treatment needed

## 2021-05-29 NOTE — Telephone Encounter (Signed)
Left message on voicemail to return my call. AL, CMA

## 2021-06-01 NOTE — Telephone Encounter (Signed)
Paragard rcvd/charged 03/24/21

## 2021-06-07 ENCOUNTER — Telehealth: Payer: Self-pay

## 2021-06-07 NOTE — Telephone Encounter (Signed)
Lft pt msg to call for bx results/sh °

## 2021-06-07 NOTE — Telephone Encounter (Signed)
-----   Message from Ralene Bathe, MD sent at 05/29/2021 10:00 AM EDT ----- Diagnosis: Comment  Comment: Specimen 1-COMPOUND MELANOCYTIC NEVUS.  Benign mole No further treatment needed

## 2021-06-13 ENCOUNTER — Telehealth: Payer: Self-pay

## 2021-06-13 NOTE — Telephone Encounter (Signed)
Left message for patient to call office for results (3rd message). Mailed letter with pathology results/hd

## 2021-06-13 NOTE — Telephone Encounter (Signed)
-----   Message from Ralene Bathe, MD sent at 05/29/2021 10:00 AM EDT ----- Diagnosis: Comment  Comment: Specimen 1-COMPOUND MELANOCYTIC NEVUS.  Benign mole No further treatment needed

## 2021-08-03 ENCOUNTER — Telehealth: Payer: Self-pay

## 2021-08-03 NOTE — Telephone Encounter (Signed)
Pt calling; is breastfeeding; what to take for allergies.  [phone]  Adv pt may take claritin or zyrtec; whatever she could do during preg she can do breastfeeding.

## 2022-05-23 ENCOUNTER — Ambulatory Visit (INDEPENDENT_AMBULATORY_CARE_PROVIDER_SITE_OTHER): Payer: Managed Care, Other (non HMO) | Admitting: Dermatology

## 2022-05-23 DIAGNOSIS — D2371 Other benign neoplasm of skin of right lower limb, including hip: Secondary | ICD-10-CM

## 2022-05-23 DIAGNOSIS — L219 Seborrheic dermatitis, unspecified: Secondary | ICD-10-CM | POA: Diagnosis not present

## 2022-05-23 DIAGNOSIS — Z79899 Other long term (current) drug therapy: Secondary | ICD-10-CM

## 2022-05-23 DIAGNOSIS — L578 Other skin changes due to chronic exposure to nonionizing radiation: Secondary | ICD-10-CM

## 2022-05-23 DIAGNOSIS — L905 Scar conditions and fibrosis of skin: Secondary | ICD-10-CM

## 2022-05-23 DIAGNOSIS — L409 Psoriasis, unspecified: Secondary | ICD-10-CM

## 2022-05-23 DIAGNOSIS — L700 Acne vulgaris: Secondary | ICD-10-CM | POA: Diagnosis not present

## 2022-05-23 DIAGNOSIS — D229 Melanocytic nevi, unspecified: Secondary | ICD-10-CM

## 2022-05-23 DIAGNOSIS — Z1283 Encounter for screening for malignant neoplasm of skin: Secondary | ICD-10-CM

## 2022-05-23 DIAGNOSIS — D239 Other benign neoplasm of skin, unspecified: Secondary | ICD-10-CM

## 2022-05-23 DIAGNOSIS — L814 Other melanin hyperpigmentation: Secondary | ICD-10-CM

## 2022-05-23 MED ORDER — CLINDAMYCIN PHOSPHATE 1 % EX LOTN: TOPICAL_LOTION | CUTANEOUS | 11 refills | Status: DC

## 2022-05-23 MED ORDER — ADAPALENE 0.3 % EX GEL: CUTANEOUS | 12 refills | Status: DC
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
## 2022-05-23 NOTE — Patient Instructions (Signed)
Due to recent changes in healthcare laws, you may see results of your pathology and/or laboratory studies on MyChart before the doctors have had a chance to review them. We understand that in some cases there may be results that are confusing or concerning to you. Please understand that not all results are received at the same time and often the doctors may need to interpret multiple results in order to provide you with the best plan of care or course of treatment. Therefore, we ask that you please give us 2 business days to thoroughly review all your results before contacting the office for clarification. Should we see a critical lab result, you will be contacted sooner.   If You Need Anything After Your Visit  If you have any questions or concerns for your doctor, please call our main line at [phone] and press option 4 to reach your doctor's medical assistant. If no one answers, please leave a voicemail as directed and we will return your call as soon as possible. Messages left after 4 pm will be answered the following business day.   You may also send us a message via MyChart. We typically respond to MyChart messages within 1-2 business days.  For prescription refills, please ask your pharmacy to contact our office. Our fax number is [phone].  If you have an urgent issue when the clinic is closed that cannot wait until the next business day, you can page your doctor at the number below.    Please note that while we do our best to be available for urgent issues outside of office hours, we are not available 24/7.   If you have an urgent issue and are unable to reach us, you may choose to seek medical care at your doctor's office, retail clinic, urgent care center, or emergency room.  If you have a medical emergency, please immediately call 911 or go to the emergency department.  Pager Numbers  - Dr. Kowalski: [phone]  - Dr. Moye: [phone]  - Dr. Stewart:  [phone]  In the event of inclement weather, please call our main line at [phone] for an update on the status of any delays or closures.  Dermatology Medication Tips: Please keep the boxes that topical medications come in in order to help keep track of the instructions about where and how to use these. Pharmacies typically print the medication instructions only on the boxes and not directly on the medication tubes.   If your medication is too expensive, please contact our office at [phone] option 4 or send us a message through MyChart.   We are unable to tell what your co-pay for medications will be in advance as this is different depending on your insurance coverage. However, we may be able to find a substitute medication at lower cost or fill out paperwork to get insurance to cover a needed medication.   If a prior authorization is required to get your medication covered by your insurance company, please allow us 1-2 business days to complete this process.  Drug prices often vary depending on where the prescription is filled and some pharmacies may offer cheaper prices.  The website www.goodrx.com contains coupons for medications through different pharmacies. The prices here do not account for what the cost may be with help from insurance (it may be cheaper with your insurance), but the website can give you the price if you did not use any insurance.  - You can print the associated coupon and take it with   your prescription to the pharmacy.  - You may also stop by our office during regular business hours and pick up a GoodRx coupon card.  - If you need your prescription sent electronically to a different pharmacy, notify our office through [facility] MyChart or by phone at [phone] option 4.     Si Usted Necesita Algo Despus de Su Visita  Tambin puede enviarnos un mensaje a travs de MyChart. Por lo general respondemos a los mensajes de MyChart en el transcurso de 1 a 2  das hbiles.  Para renovar recetas, por favor pida a su farmacia que se ponga en contacto con nuestra oficina. Nuestro nmero de fax es el [phone].  Si tiene un asunto urgente cuando la clnica est cerrada y que no puede esperar hasta el siguiente da hbil, puede llamar/localizar a su doctor(a) al nmero que aparece a continuacin.   Por favor, tenga en cuenta que aunque hacemos todo lo posible para estar disponibles para asuntos urgentes fuera del horario de oficina, no estamos disponibles las 24 horas del da, los 7 das de la semana.   Si tiene un problema urgente y no puede comunicarse con nosotros, puede optar por buscar atencin mdica  en el consultorio de su doctor(a), en una clnica privada, en un centro de atencin urgente o en una sala de emergencias.  Si tiene una emergencia mdica, por favor llame inmediatamente al 911 o vaya a la sala de emergencias.  Nmeros de bper  - Dr. Kowalski: [phone]  - Dra. Moye: [phone]  - Dra. Stewart: [phone]  En caso de inclemencias del tiempo, por favor llame a nuestra lnea principal al [phone] para una actualizacin sobre el estado de cualquier retraso o cierre.  Consejos para la medicacin en dermatologa: Por favor, guarde las cajas en las que vienen los medicamentos de uso tpico para ayudarle a seguir las instrucciones sobre dnde y cmo usarlos. Las farmacias generalmente imprimen las instrucciones del medicamento slo en las cajas y no directamente en los tubos del medicamento.   Si su medicamento es muy caro, por favor, pngase en contacto con nuestra oficina llamando al [phone] y presione la opcin 4 o envenos un mensaje a travs de MyChart.   No podemos decirle cul ser su copago por los medicamentos por adelantado ya que esto es diferente dependiendo de la cobertura de su seguro. Sin embargo, es posible que podamos encontrar un medicamento sustituto a menor costo o llenar un formulario para que el  seguro cubra el medicamento que se considera necesario.   Si se requiere una autorizacin previa para que su compaa de seguros cubra su medicamento, por favor permtanos de 1 a 2 das hbiles para completar este proceso.  Los precios de los medicamentos varan con frecuencia dependiendo del lugar de dnde se surte la receta y alguna farmacias pueden ofrecer precios ms baratos.  El sitio web www.goodrx.com tiene cupones para medicamentos de diferentes farmacias. Los precios aqu no tienen en cuenta lo que podra costar con la ayuda del seguro (puede ser ms barato con su seguro), pero el sitio web puede darle el precio si no utiliz ningn seguro.  - Puede imprimir el cupn correspondiente y llevarlo con su receta a la farmacia.  - Tambin puede pasar por nuestra oficina durante el horario de atencin regular y recoger una tarjeta de cupones de GoodRx.  - Si necesita que su receta se enve electrnicamente a una farmacia diferente, informe a nuestra oficina a travs de MyChart de [facility]   o por telfono llamando al [phone] y presione la opcin 4.  

## 2022-06-02 ENCOUNTER — Encounter: Payer: Self-pay | Admitting: Dermatology

## 2022-09-03 ENCOUNTER — Telehealth: Payer: Self-pay

## 2022-09-03 DIAGNOSIS — L700 Acne vulgaris: Secondary | ICD-10-CM

## 2022-09-03 MED ORDER — CLINDAMYCIN PHOSPHATE 1 % EX LOTN: TOPICAL_LOTION | CUTANEOUS | 6 refills | Status: DC

## 2022-09-03 MED ORDER — ADAPALENE 0.3 % EX GEL: CUTANEOUS | 6 refills | Status: DC

## 2022-09-03 NOTE — Telephone Encounter (Signed)
Patient called requesting refills of Adapalene gel and Clindamycin lotion sent to [facility] [address],.  Ok refills sent to [facility] [address]

## 2023-05-29 ENCOUNTER — Ambulatory Visit: Payer: Managed Care, Other (non HMO) | Admitting: Dermatology

## 2023-06-10 ENCOUNTER — Encounter: Payer: Self-pay | Admitting: Dermatology

## 2023-06-10 ENCOUNTER — Ambulatory Visit: Payer: Managed Care, Other (non HMO) | Admitting: Dermatology

## 2023-06-10 DIAGNOSIS — D229 Melanocytic nevi, unspecified: Secondary | ICD-10-CM

## 2023-06-10 DIAGNOSIS — L821 Other seborrheic keratosis: Secondary | ICD-10-CM | POA: Diagnosis not present

## 2023-06-10 DIAGNOSIS — L700 Acne vulgaris: Secondary | ICD-10-CM

## 2023-06-10 DIAGNOSIS — Z1283 Encounter for screening for malignant neoplasm of skin: Secondary | ICD-10-CM | POA: Diagnosis not present

## 2023-06-10 DIAGNOSIS — D239 Other benign neoplasm of skin, unspecified: Secondary | ICD-10-CM

## 2023-06-10 DIAGNOSIS — L578 Other skin changes due to chronic exposure to nonionizing radiation: Secondary | ICD-10-CM | POA: Diagnosis not present

## 2023-06-10 DIAGNOSIS — L814 Other melanin hyperpigmentation: Secondary | ICD-10-CM | POA: Diagnosis not present

## 2023-06-10 DIAGNOSIS — Z7189 Other specified counseling: Secondary | ICD-10-CM

## 2023-06-10 DIAGNOSIS — Z79899 Other long term (current) drug therapy: Secondary | ICD-10-CM

## 2023-06-10 DIAGNOSIS — L719 Rosacea, unspecified: Secondary | ICD-10-CM

## 2023-06-10 MED ORDER — ADAPALENE 0.3 % EX GEL: CUTANEOUS | 11 refills | Status: DC

## 2023-06-10 MED ORDER — CLINDAMYCIN PHOSPHATE 1 % EX LOTN: TOPICAL_LOTION | CUTANEOUS | 11 refills | Status: AC

## 2023-06-10 NOTE — Patient Instructions (Signed)

## 2023-06-10 NOTE — Progress Notes (Signed)
Follow-Up Visit   Subjective  Alexandria Owen is a 32 y.o. female who presents for the following: Skin Cancer Screening and Full Body Skin Exam  The patient presents for Total-Body Skin Exam (TBSE) for skin cancer screening and mole check. The patient has spots, moles and lesions to be evaluated, some may be new or changing and the patient may have concern these could be cancer. She has acne of the face, controlled with clindamycin lotion and adapalene 0.3% gel.    The following portions of the chart were reviewed this encounter and updated as appropriate: medications, allergies, medical history  Review of Systems:  No other skin or systemic complaints except as noted in HPI or Assessment and Plan.  Objective  Well appearing patient in no apparent distress; mood and affect are within normal limits.  A full examination was performed including scalp, head, eyes, ears, nose, lips, neck, chest, axillae, abdomen, back, buttocks, bilateral upper extremities, bilateral lower extremities, hands, feet, fingers, toes, fingernails, and toenails. All findings within normal limits unless otherwise noted below.   Relevant physical exam findings are noted in the Assessment and Plan.    Assessment & Plan   SKIN CANCER SCREENING PERFORMED TODAY.  ACTINIC DAMAGE - Chronic condition, secondary to cumulative UV/sun exposure - diffuse scaly erythematous macules with underlying dyspigmentation - Recommend daily broad spectrum sunscreen SPF 30+ to sun-exposed areas, reapply every 2 hours as needed.  - Staying in the shade or wearing long sleeves, sun glasses (UVA+UVB protection) and wide brim hats (4-inch brim around the entire circumference of the hat) are also recommended for sun protection.  - Call for new or changing lesions.  LENTIGINES, SEBORRHEIC KERATOSES, HEMANGIOMAS - Benign normal skin lesions - Benign-appearing - Call for any changes  MELANOCYTIC NEVI - Tan-brown and/or  pink-flesh-colored symmetric macules and papules - Benign appearing on exam today - Observation - Call clinic for new or changing moles - Recommend daily use of broad spectrum spf 30+ sunscreen to sun-exposed areas.   ACNE VULGARIS Exam: Face clear today.   Chronic condition with duration or expected duration over one year. Currently well-controlled.   Treatment Plan: Continue adapalene 0.3% gel at bedtime as tolerated 1 yr Rf. Topical retinoid medications like tretinoin/Retin-A, adapalene/Differin, tazarotene/Fabior, and Epiduo/Epiduo Forte can cause dryness and irritation when first started. Only apply a pea-sized amount to the entire affected area. Avoid applying it around the eyes, edges of mouth and creases at the nose. If you experience irritation, use a good moisturizer first and/or apply the medicine less often. If you are doing well with the medicine, you can increase how often you use it until you are applying every night. Be careful with sun protection while using this medication as it can make you sensitive to the sun. This medicine should not be used by pregnant women.   Continue clindamycin lotion every morning 1 yr Rf.   ROSACEA Exam Mild erythema with telangiectasias   Chronic and persistent condition with duration or expected duration over one year. Condition is symptomatic/ bothersome to patient. Not currently at goal.  Rosacea is a chronic progressive skin condition usually affecting the face of adults, causing redness and/or acne bumps. It is treatable but not curable. It sometimes affects the eyes (ocular rosacea) as well. It may respond to topical and/or systemic medication and can flare with stress, sun exposure, alcohol, exercise, topical steroids (including hydrocortisone/cortisone 10) and some foods.  Daily application of broad spectrum spf 30+ sunscreen to face is  recommended to reduce flares.  Patient denies grittiness of the eyes.  Treatment Plan Mild, no  treatment. Pinkness may improve with clindamycin lotion.   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  - Genetic - Benign, observe - Call for any changes - Photo taken today. Acne vulgaris  Related Medications Adapalene (DIFFERIN) 0.3 % gel Apply a pea sized amount to the entire face at bedtime as tolerated.  clindamycin (CLEOCIN-T) 1 % lotion Apply a thin coat to the face QD.   Return in about 1 year (around 06/09/2024) for TBSE.  Wendee Beavers, CMA, am acting as scribe for Armida Sans, MD .   Documentation: I have reviewed the above documentation for accuracy and completeness, and I agree with the above.  Armida Sans, MD

## 2023-06-22 ENCOUNTER — Encounter: Payer: Self-pay | Admitting: Dermatology

## 2024-06-10 ENCOUNTER — Ambulatory Visit: Payer: Managed Care, Other (non HMO) | Admitting: Dermatology

## 2024-06-24 ENCOUNTER — Ambulatory Visit: Payer: Self-pay | Admitting: Licensed Practical Nurse

## 2024-07-01 NOTE — Patient Instructions (Signed)
 Preventive Care 28-35 Years Old, Female  Preventive care refers to lifestyle choices and visits with your health care provider that can promote health and wellness. Preventive care visits are also called wellness exams. What can I expect for my preventive care visit? Counseling During your preventive care visit, your health care provider may ask about your: Medical history, including: Past medical problems. Family medical history. Pregnancy history. Current health, including: Menstrual cycle. Method of birth control. Emotional well-being. Home life and relationship well-being. Sexual activity and sexual health. Lifestyle, including: Alcohol, nicotine or tobacco, and drug use. Access to firearms. Diet, exercise, and sleep habits. Work and work Astronomer. Sunscreen use. Safety issues such as seatbelt and bike helmet use. Physical exam Your health care provider may check your: Height and weight. These may be used to calculate your BMI (body mass index). BMI is a measurement that tells if you are at a healthy weight. Waist circumference. This measures the distance around your waistline. This measurement also tells if you are at a healthy weight and may help predict your risk of certain diseases, such as type 2 diabetes and high blood pressure. Heart rate and blood pressure. Body temperature. Skin for abnormal spots. What immunizations do I need?  Vaccines are usually given at various ages, according to a schedule. Your health care provider will recommend vaccines for you based on your age, medical history, and lifestyle or other factors, such as travel or where you work. What tests do I need? Screening Your health care provider may recommend screening tests for certain conditions. This may include: Pelvic exam and Pap test. Lipid and cholesterol levels. Diabetes screening. This is done by checking your blood sugar (glucose) after you have not eaten for a while  (fasting). Hepatitis B test. Hepatitis C test. HIV (human immunodeficiency virus) test. STI (sexually transmitted infection) testing, if you are at risk. BRCA-related cancer screening. This may be done if you have a family history of breast, ovarian, tubal, or peritoneal cancers. Talk with your health care provider about your test results, treatment options, and if necessary, the need for more tests. Follow these instructions at home: Eating and drinking  Eat a healthy diet that includes fresh fruits and vegetables, whole grains, lean protein, and low-fat dairy products. Take vitamin and mineral supplements as recommended by your health care provider. Do not drink alcohol if: Your health care provider tells you not to drink. You are pregnant, may be pregnant, or are planning to become pregnant. If you drink alcohol: Limit how much you have to 0-1 drink a day. Know how much alcohol is in your drink. In the [place] one drink equals one 12 oz bottle of beer (355 mL), one 5 oz glass of wine (148 mL), or one 1 oz glass of hard liquor (44 mL). Lifestyle Brush your teeth every morning and night with fluoride toothpaste. Floss one time each day. Exercise for at least 30 minutes 5 or more days each week. Do not use any products that contain nicotine or tobacco. These products include cigarettes, chewing tobacco, and vaping devices, such as e-cigarettes. If you need help quitting, ask your health care provider. Do not use drugs. If you are sexually active, practice safe sex. Use a condom or other form of protection to prevent STIs. If you do not wish to become pregnant, use a form of birth control. If you plan to become pregnant, see your health care provider for a prepregnancy visit. Find healthy ways to manage stress, such as:  Meditation, yoga, or listening to music. Journaling. Talking to a trusted person. Spending time with friends and family. Minimize exposure to UV radiation to reduce your  risk of skin cancer. Safety Always wear your seat belt while driving or riding in a vehicle. Do not drive: If you have been drinking alcohol. Do not ride with someone who has been drinking. If you have been using any mind-altering substances or drugs. While texting. When you are tired or distracted. Wear a helmet and other protective equipment during sports activities. If you have firearms in your house, make sure you follow all gun safety procedures. Seek help if you have been physically or sexually abused. What's next? Go to your health care provider once a year for an annual wellness visit. Ask your health care provider how often you should have your eyes and teeth checked. Stay up to date on all vaccines. This information is not intended to replace advice given to you by your health care provider. Make sure you discuss any questions you have with your health care provider. Document Revised: 01/25/2021 Document Reviewed: 01/25/2021 Elsevier Patient Education  2024 [employer]     How to Do a Breast Self-Exam Doing breast self-exams can help you stay healthy. They're one way to know what's normal for your breasts. They can help you catch a problem while it's still small and can be treated. You need to: Check your breasts often. Tell your doctor about any changes. You should do breast self-exams even if you have breast implants. What you need: A mirror. A well-lit room. A pillow or other soft object. How to do a breast self-exam Look for changes  Take off all the clothes above your waist. Stand in front of a mirror in a room with good lighting. Put your hands down at your sides. Compare your breasts in the mirror. Look for difference between them, such as: Differences in shape. Differences in size. Wrinkles, dips, and bumps in one breast and not the other. Look at each breast for skin changes, such as: Redness. Scaly spots. Spots where your skin is  thicker. Dimpling. Open sores. Look for changes in your nipples, such as: Fluid coming out of a nipple. Fluid around a nipple. Bleeding. Dimpling. Redness. A nipple that looks pushed in or that has changed position. Feel for changes Lie on your back. Feel each breast. To do this: Pick a breast to feel. Place a pillow under the shoulder closest to that breast. Put the arm closest to that breast behind your head. Feel the breast using the hand of your other arm. Use the pads of your three middle fingers to make small circles starting near the nipple. Use light, medium, and firm pressure. Keep making circles, moving down over the breast. Stop when you feel your ribs. Start making circles with your fingers again, this time going up until you reach your collarbone. Then, make circles out across your breast and into your armpit area. Squeeze your nipple. Check for fluid and lumps. Do these steps again to check your other breast. Sit or stand in the tub or shower. With soapy water on your skin, feel each breast the same way you did when you were lying down. Write down what you find Writing down what you find can help you keep track of what you want to tell your doctor. Write down: What's normal for each breast. Any changes you find. Write down: The kind of change. If your breast feels tender or painful.  Any lump you find. Write down its size and where it is. When you last had your period. General tips If you're breastfeeding, the best time to check your breasts is after you feed your baby or after you use a breast pump. If you get a period, the best time to check your breasts is 5-7 days after your period ends. With time, you'll get more used to doing the self-exam. You'll also start to know if there are changes in your breasts. Contact a doctor if: You see a change in the shape or size of your breasts or nipples. You see a change in the skin of your breast or nipples. You have fluid  coming from your nipples that isn't normal. You find a new lump or thick area. You have breast pain. You have any concerns about your breast health. This information is not intended to replace advice given to you by your health care provider. Make sure you discuss any questions you have with your health care provider. Document Revised: 10/09/2023 Document Reviewed: 10/09/2023 Elsevier Patient Education  2025 [employer].

## 2024-07-01 NOTE — Progress Notes (Unsigned)
 Gynecology Annual Exam   PCP: Pa, [facility]  Chief Complaint: No chief complaint on file.   History of Present Illness: Patient is a 33 y.o. [id number] presents for annual exam.  Varicose veins in pregnancy, one never went down, painful from hip to knee, it sometimes is hot to touch, had a scan saw someone in [place], needs referral to [facility]   I am  considering another pregnancy, what are the risks now that I am older? Has Paragard , inserted 2022, she is not ready to have it removed just yet   I get a pain on my side halfway through my cycle    LMP: November 5, 20025  Average Interval: regular, 28 days Duration of flow: 5 days Heavy Menses: no Clots: no Intermenstrual Bleeding: no Postcoital Bleeding: no Dysmenorrhea: no  The patient is sexually active with 1 female partner. She currently uses IUD for contraception. She denies dyspareunia.  The patient does perform self breast exams.  There is no notable family history of breast or ovarian cancer in her family.  The patient wears seatbelts: yes.   The patient has regular exercise: no. Just joined to [employer], likes spin class  The patient denies current symptoms of depression.   [facility] Lives with husband and 2 kids, 5 and 3, feels safe  PCP no  Dentist up to date  Wears contacts and glasses, last exam June Occasional alcohol  Had chicken pox vaccine   Review of Systems: ROS see HPI   Past Medical History:  Patient Active Problem List   Diagnosis Date Noted   [redacted] weeks gestation of pregnancy 02/05/2021   Encounter for care or examination of lactating mother 02/05/2021   Encounter for induction of labor 02/04/2021   Supervision of other normal pregnancy, antepartum 06/24/2020     Nursing Staff Provider  Office Location  [facility] Dating   LMP  Language  English Anatomy US    complete, normal  Flu Vaccine   UTD Genetic Screen  NIPS:   Neg x 3, XX   TDaP vaccine   11/22/20 Hgb A1C or  GTT Early :  n/a Third trimester : 128  Rhogam   n/a   LAB RESULTS   Feeding Plan  breast Blood Type O/Positive/-- (11/12 1228)   Contraception  Copper  IUD Antibody Negative (11/12 1228)  Circumcision  n/a Rubella 1.56 (11/12 1228)  Pediatrician   RPR Non Reactive (03/29 1132)   Support Person  Elsie HBsAg Negative (11/12 1228)   Prenatal Classes   HIV Non Reactive (03/29 1132)    Varicella  immune  BTL Consent  n/a GBS  (For PCN allergy, check sensitivities)        VBAC Consent  n/a Pap  06/24/20 NILM    Hgb Electro   n/a   COVID  vax and boosted CF      SMA             Postpartum care following vaginal delivery 06/05/2019   Heart palpitations 02/02/2016    Past Surgical History:  Past Surgical History:  Procedure Laterality Date   NO PAST SURGERIES      Gynecologic History:  Patient's last menstrual period was 06/17/2024 (exact date). Contraception: IUD Last Pap: 06/24/2020 Results were: no abnormalities   Obstetric History: G2P2002  Family History:  Family History  Problem Relation Age of Onset   Breast cancer Neg Hx    Ovarian cancer Neg Hx     Social History:  Social  History   Socioeconomic History   Marital status: Married    Spouse name: Not on file   Number of children: Not on file   Years of education: Not on file   Highest education level: Not on file  Occupational History   Not on file  Tobacco Use   Smoking status: Never   Smokeless tobacco: Never  Vaping Use   Vaping status: Never Used  Substance and Sexual Activity   Alcohol use: Not Currently    Comment: occasional   Drug use: No   Sexual activity: Yes    Birth control/protection: I.U.D.  Other Topics Concern   Not on file  Social History Narrative   Not on file   Social Drivers of Health   Financial Resource Strain: Not on file  Food Insecurity: Not on file  Transportation Needs: Not on file  Physical Activity: Not on file  Stress: Not on file  Social Connections: Not on file   Intimate Partner Violence: Not on file    Allergies:  Not on File  Medications: Prior to Admission medications   Medication Sig Start Date End Date Taking? Authorizing Provider  Adapalene  (DIFFERIN ) 0.3 % gel Apply a pea sized amount to the entire face at bedtime as tolerated. 06/10/23   Hester Alm BROCKS, MD  calcium carbonate (OSCAL) 1500 (600 Ca) MG TABS tablet Take by mouth 2 (two) times daily with a meal.    [provider]  cetirizine (ZYRTEC) 10 MG tablet Take 10 mg by mouth daily.    [provider]  clindamycin  (CLEOCIN -T) 1 % lotion Apply a thin coat to the face QD. 06/10/23   Hester Alm BROCKS, MD  ketoconazole  (NIZORAL ) 2 % shampoo Apply 1 application topically 2 (two) times a week. Leave on about 5 minutes before rinsing Patient not taking: Reported on 05/23/2022 05/18/21   Hester Alm BROCKS, MD  Prenatal Vit-Fe Fumarate-FA (MULTIVITAMIN-PRENATAL) 27-0.8 MG TABS tablet Take 1 tablet by mouth daily at 12 noon.    [provider]    Physical Exam Vitals: currently breastfeeding.  General: NAD HEENT: normocephalic, anicteric Thyroid: no enlargement, no palpable nodules Pulmonary: No increased work of breathing, CTAB Cardiovascular: RRR, distal pulses 2+ Breast: Breast symmetrical, no tenderness, no palpable nodules or masses, no skin or nipple retraction present, no nipple discharge.  No axillary or supraclavicular lymphadenopathy. Abdomen: NABS, soft, non-tender, non-distended.  Umbilicus without lesions.  No hepatomegaly, splenomegaly or masses palpable. No evidence of hernia  Genitourinary:  External: Normal external female genitalia.  Normal urethral meatus, normal Bartholin's and Skene's glands.    Vagina: Normal vaginal mucosa, no evidence of prolapse.  Good tone  Cervix: Grossly normal in appearance, no bleeding IUD strings visible   Uterus: Non-enlarged, mobile, normal contour.  No CMT  Adnexa: ovaries non-enlarged, no adnexal  masses  Rectal: deferred  Lymphatic: no evidence of inguinal lymphadenopathy Extremities: no edema, erythema, or tenderness Neurologic: Grossly intact Psychiatric: mood appropriate, affect full   Assessment: 33 y.o. G2P2002 routine annual exam  Plan: Problem List Items Addressed This Visit   None Visit Diagnoses       Varicose veins of leg with complications    -  Primary   Relevant Orders   Ambulatory Referral for Peripheral Vascular Consult     Encounter for immunization       Relevant Orders   Flu vaccine trivalent PF, 6mos and older(Flulaval,Afluria,Fluarix,Fluzone) (Completed)     Well woman exam       Relevant  Orders   Varicella zoster antibody, IgG   Rubella screen   HEP, RPR, HIV Panel   Lipid panel   CBC w/Diff/Platelet   Hemoglobin A1c   Cytology - PAP     Encounter for preconception consultation       Relevant Orders   Varicella zoster antibody, IgG   Rubella screen     Screening examination for venereal disease       Relevant Orders   HEP, RPR, HIV Panel   Lipid panel   Cytology - PAP     Screening for diabetes mellitus       Relevant Orders   Hemoglobin A1c     Cervical cancer screening       Relevant Orders   Cytology - PAP       2) STI screening  wasoffered and accepted  2)  ASCCP guidelines and rational discussed.  Patient opts for every 5 years screening interval  3) Contraception - the patient is currently using  IUD.  She is happy with her current form of contraception and plans to continue, will return when she desires IUD removal -encouraged PNV and Folic Acid -Discussed POC for AMA less than 40, you can have a healthy and normal pregnancy at 35.   4) Routine healthcare maintenance including cholesterol, diabetes screening discussed Ordered today   5) No follow-ups on file.  Jinnie Cookey, CNM  [facility] 07/02/2024, 7:49 PM

## 2024-07-02 ENCOUNTER — Ambulatory Visit: Payer: Self-pay | Admitting: Licensed Practical Nurse

## 2024-07-02 ENCOUNTER — Other Ambulatory Visit (HOSPITAL_COMMUNITY)
Admission: RE | Admit: 2024-07-02 | Discharge: 2024-07-02 | Disposition: A | Source: Ambulatory Visit | Attending: Licensed Practical Nurse | Admitting: Licensed Practical Nurse

## 2024-07-02 ENCOUNTER — Encounter: Payer: Self-pay | Admitting: Licensed Practical Nurse

## 2024-07-02 VITALS — BP 123/89 | HR 73 | Ht 68.0 in | Wt 172.1 lb

## 2024-07-02 DIAGNOSIS — Z01419 Encounter for gynecological examination (general) (routine) without abnormal findings: Secondary | ICD-10-CM | POA: Diagnosis present

## 2024-07-02 DIAGNOSIS — I83899 Varicose veins of unspecified lower extremities with other complications: Secondary | ICD-10-CM

## 2024-07-02 DIAGNOSIS — Z23 Encounter for immunization: Secondary | ICD-10-CM | POA: Diagnosis not present

## 2024-07-02 DIAGNOSIS — Z124 Encounter for screening for malignant neoplasm of cervix: Secondary | ICD-10-CM | POA: Insufficient documentation

## 2024-07-02 DIAGNOSIS — Z3169 Encounter for other general counseling and advice on procreation: Secondary | ICD-10-CM

## 2024-07-02 DIAGNOSIS — Z131 Encounter for screening for diabetes mellitus: Secondary | ICD-10-CM

## 2024-07-02 DIAGNOSIS — Z113 Encounter for screening for infections with a predominantly sexual mode of transmission: Secondary | ICD-10-CM

## 2024-07-03 LAB — CBC WITH DIFFERENTIAL/PLATELET
Basophils Absolute: 0 x10E3/uL (ref 0.0–0.2)
Basos: 0 %
EOS (ABSOLUTE): 0.1 x10E3/uL (ref 0.0–0.4)
Eos: 1 %
Hematocrit: 38.9 % (ref 34.0–46.6)
Hemoglobin: 13.1 g/dL (ref 11.1–15.9)
Immature Grans (Abs): 0 x10E3/uL (ref 0.0–0.1)
Immature Granulocytes: 0 %
Lymphocytes Absolute: 2.1 x10E3/uL (ref 0.7–3.1)
Lymphs: 33 %
MCH: 30.5 pg (ref 26.6–33.0)
MCHC: 33.7 g/dL (ref 31.5–35.7)
MCV: 91 fL (ref 79–97)
Monocytes Absolute: 0.7 x10E3/uL (ref 0.1–0.9)
Monocytes: 11 %
Neutrophils Absolute: 3.4 x10E3/uL (ref 1.4–7.0)
Neutrophils: 55 %
Platelets: 328 x10E3/uL (ref 150–450)
RBC: 4.3 x10E6/uL (ref 3.77–5.28)
RDW: 12.4 % (ref 11.7–15.4)
WBC: 6.2 x10E3/uL (ref 3.4–10.8)

## 2024-07-03 LAB — RUBELLA SCREEN: Rubella Antibodies, IGG: 1.32 {index} (ref >0.99)

## 2024-07-03 LAB — HEP, RPR, HIV PANEL
HIV Screen 4th Generation wRfx: NONREACTIVE
Hepatitis B Surface Ag: NEGATIVE
RPR Ser Ql: NONREACTIVE

## 2024-07-03 LAB — HEMOGLOBIN A1C
Est. average glucose Bld gHb Est-mCnc: 111 mg/dL
Hgb A1c MFr Bld: 5.5 % (ref 4.8–5.6)

## 2024-07-03 LAB — VARICELLA ZOSTER ANTIBODY, IGG: Varicella zoster IgG: REACTIVE

## 2024-07-06 LAB — CYTOLOGY - PAP
Chlamydia: NEGATIVE
Comment: NEGATIVE
Comment: NEGATIVE
Comment: NORMAL
Diagnosis: NEGATIVE
High risk HPV: NEGATIVE
Neisseria Gonorrhea: NEGATIVE

## 2024-07-08 ENCOUNTER — Other Ambulatory Visit (INDEPENDENT_AMBULATORY_CARE_PROVIDER_SITE_OTHER): Payer: Self-pay | Admitting: Vascular Surgery

## 2024-07-08 DIAGNOSIS — I83899 Varicose veins of unspecified lower extremities with other complications: Secondary | ICD-10-CM

## 2024-07-14 ENCOUNTER — Ambulatory Visit (INDEPENDENT_AMBULATORY_CARE_PROVIDER_SITE_OTHER): Admitting: Vascular Surgery

## 2024-07-14 ENCOUNTER — Encounter (INDEPENDENT_AMBULATORY_CARE_PROVIDER_SITE_OTHER): Payer: Self-pay | Admitting: Vascular Surgery

## 2024-07-14 ENCOUNTER — Ambulatory Visit (INDEPENDENT_AMBULATORY_CARE_PROVIDER_SITE_OTHER)

## 2024-07-14 VITALS — BP 126/85 | HR 88 | Resp 18 | Ht 68.0 in | Wt 171.0 lb

## 2024-07-14 DIAGNOSIS — I83899 Varicose veins of unspecified lower extremities with other complications: Secondary | ICD-10-CM | POA: Diagnosis not present

## 2024-07-14 DIAGNOSIS — I83813 Varicose veins of bilateral lower extremities with pain: Secondary | ICD-10-CM

## 2024-07-16 ENCOUNTER — Encounter (INDEPENDENT_AMBULATORY_CARE_PROVIDER_SITE_OTHER): Payer: Self-pay | Admitting: Vascular Surgery

## 2024-07-16 NOTE — Progress Notes (Signed)
 Subjective:    Patient ID: Alexandria Owen, female    DOB: 02-16-1991, 33 y.o.   MRN: [id number] Chief Complaint  Patient presents with   New Patient (Initial Visit)    Ref Dominic consult le reflux, varicose veins with complications    Alexandria Owen is a 33 yo female who presents to clinic today as a new patient consult for bilateral lower extremity varicose veins that are painful.  Patient endorses that she has had them for a couple of years but worsened after having her first child.  She endorses she is interested in some sort of treatment for the varicose veins due to pain it has been increasing over the last couple of months with ambulation and exercise.  She is also seeks treatment prior to having other children as that may be a plan for her future.  She currently states she is very active with a child and is consistently on her feet.  She notices cramping and pain to both of her legs left greater than right after ambulating all day.  She denies any claudication symptoms but does state that her legs feel crampy and aching.  She denies any sores or wounds to her lower extremities.    Discussed the use of AI scribe software for clinical note transcription with the patient, who gave verbal consent to proceed.  History of Present Illness            Results           Review of Systems  Constitutional: Negative.   Musculoskeletal:  Positive for myalgias.  Skin:  Positive for color change.       Positive varicose veins to both lower extremities.  Left greater than right  All other systems reviewed and are negative.      Objective:   Physical Exam Vitals reviewed.  Constitutional:      Appearance: Normal appearance. She is normal weight.  HENT:     Head: Normocephalic and atraumatic.  Eyes:     Pupils: Pupils are equal, round, and reactive to light.  Cardiovascular:     Rate and Rhythm: Normal rate and regular rhythm.     Pulses: Normal pulses.     Heart sounds: Normal  heart sounds.  Pulmonary:     Effort: Pulmonary effort is normal.     Breath sounds: Normal breath sounds.  Abdominal:     General: Abdomen is flat. Bowel sounds are normal.     Palpations: Abdomen is soft.  Musculoskeletal:        General: Tenderness present.     Comments: Painful tender varicose veins bilateral lower extremities.  Skin:    General: Skin is warm and dry.     Capillary Refill: Capillary refill takes 2 to 3 seconds.  Neurological:     General: No focal deficit present.     Mental Status: She is alert and oriented to person, place, and time. Mental status is at baseline.  Psychiatric:        Mood and Affect: Mood normal.        Behavior: Behavior normal.        Thought Content: Thought content normal.        Judgment: Judgment normal.     Physical Exam          BP 126/85 (BP Location: Right Arm)   Pulse 88   Resp 18   Ht 5' 8 (1.727 m)   Wt 171 lb (77.6 kg)  LMP 06/17/2024 (Exact Date)   BMI 26.00 kg/m   Past Medical History:  Diagnosis Date   Acne    Allergic rhinitis    Vaccine for human papilloma virus (HPV) types 6, 11, 16, and 18 administered     Social History   Socioeconomic History   Marital status: Married    Spouse name: Not on file   Number of children: Not on file   Years of education: Not on file   Highest education level: Not on file  Occupational History   Not on file  Tobacco Use   Smoking status: Never   Smokeless tobacco: Never  Vaping Use   Vaping status: Never Used  Substance and Sexual Activity   Alcohol use: Not Currently    Comment: occasional   Drug use: No   Sexual activity: Yes    Birth control/protection: I.U.D.  Other Topics Concern   Not on file  Social History Narrative   Not on file   Social Drivers of Health   Financial Resource Strain: Not on file  Food Insecurity: Not on file  Transportation Needs: Not on file  Physical Activity: Not on file  Stress: Not on file  Social Connections: Not on  file  Intimate Partner Violence: Not on file    Past Surgical History:  Procedure Laterality Date   NO PAST SURGERIES      Family History  Problem Relation Age of Onset   Diabetes Paternal Grandfather    Breast cancer Neg Hx    Ovarian cancer Neg Hx     No Known Allergies     Latest Ref Rng & Units 07/02/2024    2:24 PM 02/05/2021   10:10 AM 02/04/2021    8:51 PM  CBC  WBC 3.4 - 10.8 x10E3/uL 6.2  14.1  16.5   Hemoglobin 11.1 - 15.9 g/dL 86.8  88.9  88.2   Hematocrit 34.0 - 46.6 % 38.9  30.6  33.0   Platelets 150 - 450 x10E3/uL 328  163  181       CMP     Component Value Date/Time   NA 135 04/10/2016 1654   K 3.6 04/10/2016 1654   CL 101 04/10/2016 1654   CO2 22 04/10/2016 1654   GLUCOSE 95 04/10/2016 1654   BUN 16 04/10/2016 1654   CREATININE 0.62 04/10/2016 1654   CALCIUM 9.3 04/10/2016 1654   PROT 8.2 (H) 04/10/2016 1654   ALBUMIN 4.7 04/10/2016 1654   AST 36 04/10/2016 1654   ALT 46 04/10/2016 1654   ALKPHOS 45 04/10/2016 1654   BILITOT 1.1 04/10/2016 1654   GFRNONAA >60 04/10/2016 1654     No results found.     Assessment & Plan:   1. Varicose veins of bilateral lower extremities with pain (Primary)  Recommend:  The patient has large symptomatic varicose veins that are painful and associated with swelling. The patient is CEAP [id number]   I have had a long discussion with the patient regarding  varicose veins and why they cause symptoms.  Patient will begin wearing graduated compression stockings class 1 on a daily basis, beginning first thing in the morning and removing them in the evening. The patient is instructed specifically not to sleep in the stockings.    The patient  will also begin using over-the-counter analgesics such as Motrin  600 mg po TID to help control the symptoms.    In addition, behavioral modification including elevation during the day will be initiated.  Patient underwent bilateral lower extremity venous Doppler  ultrasounds to assess for reflux and DVTs.  There is no evidence of superficial or deep venous thrombosis in either extremity.  In her right lower extremity she has venous reflux noted in the right greater saphenous vein in the calf and in her left lower extremity she has venous reflux in the left saphenofemoral junction, left greater saphenous vein in the thigh, and in the left greater saphenous vein in the calf.  Patient agrees with conservative therapy at this point in time.  Patient will follow-up in 6 months with no studies for further evaluation..          Current Outpatient Medications on File Prior to Visit  Medication Sig Dispense Refill   Adapalene  (DIFFERIN ) 0.3 % gel Apply a pea sized amount to the entire face at bedtime as tolerated. 45 g 11   calcium carbonate (OSCAL) 1500 (600 Ca) MG TABS tablet Take by mouth 2 (two) times daily with a meal.     clindamycin  (CLEOCIN -T) 1 % lotion Apply a thin coat to the face QD. 60 mL 11   Prenatal Vit-Fe Fumarate-FA (MULTIVITAMIN-PRENATAL) 27-0.8 MG TABS tablet Take 1 tablet by mouth daily at 12 noon.     No current facility-administered medications on file prior to visit.    There are no Patient Instructions on file for this visit. No follow-ups on file.   Gwendlyn JONELLE Shank, NP

## 2024-08-31 ENCOUNTER — Ambulatory Visit: Admitting: Licensed Practical Nurse

## 2024-08-31 ENCOUNTER — Encounter: Payer: Self-pay | Admitting: Licensed Practical Nurse

## 2024-08-31 VITALS — BP 130/89 | HR 80 | Resp 16 | Ht 68.0 in | Wt 173.9 lb

## 2024-08-31 DIAGNOSIS — Z30432 Encounter for removal of intrauterine contraceptive device: Secondary | ICD-10-CM | POA: Diagnosis not present

## 2024-08-31 NOTE — Progress Notes (Signed)
" ° ° ° °  SUBJECTIVE: Here for IUD removal. Desires pregnancy is taking PNV and Folic Acid.    OBJECTIVE: BP 130/89   Pulse 80   Resp 16   Ht 5' 8 (1.727 m)   Wt 173 lb 14.4 oz (78.9 kg)   LMP 08/17/2024   BMI 26.44 kg/m   GEN: NAD  GYNECOLOGY OFFICE PROCEDURE NOTE  Mendy Chou is a 34 y.o. [id number] here for Liletta IUD removal. No GYN concerns.  Last pap smear was on 06/2024 and was normal.   IUD Removal  Patient identified, informed consent performed, consent signed.  Patient was in the dorsal lithotomy position, normal external genitalia was noted.  A speculum was placed in the patient's vagina, normal discharge was noted, no lesions. The cervix was visualized, no lesions, no abnormal discharge.  The strings of the IUD were grasped and pulled using ring forceps. The IUD was removed in its entirety. Patient tolerated the procedure well.     ASSESSMENT/PLAN: S/p IUD removal   Patient plans for pregnancy soon and she was told to avoid teratogens, take PNV and folic acid.  Routine preventative health maintenance measures emphasized.  Rubella and Varicella immune  if pregnancy has been achieved after 6 months of timed intercourse please return, otherwise return when pregnant     Lynkin Saini, Jinnie Earnie, CNM   "

## 2024-09-21 ENCOUNTER — Ambulatory Visit

## 2025-01-12 ENCOUNTER — Ambulatory Visit (INDEPENDENT_AMBULATORY_CARE_PROVIDER_SITE_OTHER): Admitting: Vascular Surgery
